# Patient Record
Sex: Male | Born: 1990 | Race: Black or African American | Hispanic: No | Marital: Single | Smoking: Current every day smoker
Health system: Southern US, Community
[De-identification: ages and names within clinical notes are randomized; demographics above are authoritative.]

## PROBLEM LIST (undated history)

## (undated) DIAGNOSIS — Z9119 Patient's noncompliance with other medical treatment and regimen: Secondary | ICD-10-CM

## (undated) DIAGNOSIS — F319 Bipolar disorder, unspecified: Secondary | ICD-10-CM

## (undated) DIAGNOSIS — A539 Syphilis, unspecified: Secondary | ICD-10-CM

## (undated) DIAGNOSIS — F209 Schizophrenia, unspecified: Secondary | ICD-10-CM

## (undated) DIAGNOSIS — Z91199 Patient's noncompliance with other medical treatment and regimen due to unspecified reason: Secondary | ICD-10-CM

## (undated) DIAGNOSIS — B20 Human immunodeficiency virus [HIV] disease: Secondary | ICD-10-CM

## (undated) DIAGNOSIS — D849 Immunodeficiency, unspecified: Secondary | ICD-10-CM

## (undated) DIAGNOSIS — Z21 Asymptomatic human immunodeficiency virus [HIV] infection status: Secondary | ICD-10-CM

## (undated) DIAGNOSIS — A749 Chlamydial infection, unspecified: Secondary | ICD-10-CM

## (undated) DIAGNOSIS — R85619 Unspecified abnormal cytological findings in specimens from anus: Secondary | ICD-10-CM

## (undated) DIAGNOSIS — C801 Malignant (primary) neoplasm, unspecified: Secondary | ICD-10-CM

## (undated) DIAGNOSIS — A549 Gonococcal infection, unspecified: Secondary | ICD-10-CM

## (undated) HISTORY — DX: Patient's noncompliance with other medical treatment and regimen due to unspecified reason: Z91.199

## (undated) HISTORY — DX: Schizophrenia, unspecified: F20.9

## (undated) HISTORY — PX: EYE SURGERY: SHX253

## (undated) HISTORY — DX: Bipolar disorder, unspecified: F31.9

## (undated) HISTORY — PX: ANUS SURGERY: SHX302

## (undated) HISTORY — DX: Human immunodeficiency virus (HIV) disease: B20

## (undated) HISTORY — DX: Syphilis, unspecified: A53.9

## (undated) HISTORY — DX: Patient's noncompliance with other medical treatment and regimen: Z91.19

## (undated) HISTORY — DX: Chlamydial infection, unspecified: A74.9

## (undated) HISTORY — DX: Unspecified abnormal cytological findings in specimens from anus: R85.619

## (undated) HISTORY — DX: Gonococcal infection, unspecified: A54.9

## (undated) HISTORY — DX: Asymptomatic human immunodeficiency virus (hiv) infection status: Z21

---
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
## 2008-03-17 ENCOUNTER — Emergency Department (HOSPITAL_COMMUNITY): Admission: EM | Admit: 2008-03-17 | Discharge: 2008-03-17 | Payer: Self-pay | Admitting: Emergency Medicine

## 2008-03-18 ENCOUNTER — Ambulatory Visit: Payer: Self-pay | Admitting: Psychiatry

## 2008-03-18 ENCOUNTER — Inpatient Hospital Stay (HOSPITAL_COMMUNITY): Admission: AD | Admit: 2008-03-18 | Discharge: 2008-03-21 | Payer: Self-pay | Admitting: Psychiatry

## 2010-03-14 ENCOUNTER — Emergency Department (HOSPITAL_COMMUNITY): Admission: EM | Admit: 2010-03-14 | Discharge: 2010-03-16 | Payer: Self-pay | Admitting: Emergency Medicine

## 2010-03-16 ENCOUNTER — Ambulatory Visit: Payer: Self-pay | Admitting: Psychiatry

## 2010-04-03 ENCOUNTER — Emergency Department (HOSPITAL_COMMUNITY): Admission: EM | Admit: 2010-04-03 | Discharge: 2010-04-03 | Payer: Self-pay | Admitting: Emergency Medicine

## 2010-07-21 ENCOUNTER — Emergency Department (HOSPITAL_COMMUNITY)
Admission: EM | Admit: 2010-07-21 | Discharge: 2010-07-21 | Payer: Self-pay | Source: Home / Self Care | Admitting: Emergency Medicine

## 2010-07-26 LAB — COMPREHENSIVE METABOLIC PANEL
AST: 25 U/L (ref 0–37)
Albumin: 2.6 g/dL — ABNORMAL LOW (ref 3.5–5.2)
BUN: 4 mg/dL — ABNORMAL LOW (ref 6–23)
Chloride: 105 mEq/L (ref 96–112)
Creatinine, Ser: 0.92 mg/dL (ref 0.4–1.5)
GFR calc Af Amer: >60 mL/min (ref >60)
Potassium: 3.6 mEq/L (ref 3.5–5.1)
Total Protein: 8.1 g/dL (ref 6.0–8.3)

## 2010-07-26 LAB — CBC
Hemoglobin: 11.9 g/dL — ABNORMAL LOW (ref 13.0–17.0)
MCH: 29.7 pg (ref 26.0–34.0)
MCHC: 33.2 g/dL (ref 30.0–36.0)
Platelets: 239 10*3/uL (ref 150–400)
RBC: 4.01 MIL/uL — ABNORMAL LOW (ref 4.22–5.81)

## 2010-07-26 LAB — URINALYSIS, ROUTINE W REFLEX MICROSCOPIC
Hgb urine dipstick: NEGATIVE
Specific Gravity, Urine: 1.015 (ref 1.005–1.030)
Urine Glucose, Fasting: NEGATIVE mg/dL
pH: 6.5 (ref 5.0–8.0)

## 2010-07-26 LAB — DIFFERENTIAL
Basophils Absolute: 0 10*3/uL (ref 0.0–0.1)
Basophils Relative: 1 % (ref 0–1)
Eosinophils Absolute: 0.3 10*3/uL (ref 0.0–0.7)
Monocytes Relative: 14 % — ABNORMAL HIGH (ref 3–12)
Neutro Abs: 3.5 10*3/uL (ref 1.7–7.7)
Neutrophils Relative %: 55 % (ref 43–77)

## 2010-07-26 LAB — CK: Total CK: 134 U/L (ref 7–232)

## 2010-09-16 LAB — T-HELPER CELLS (CD4) COUNT (NOT AT ARMC)
CD4 % Helper T Cell: 34 % (ref 33–55)
CD4 T Cell Abs: 440 uL (ref 400–2700)

## 2010-09-16 LAB — DIFFERENTIAL
Basophils Absolute: 0 10*3/uL (ref 0.0–0.1)
Eosinophils Absolute: 0.1 10*3/uL (ref 0.0–0.7)
Eosinophils Relative: 1 % (ref 0–5)
Lymphocytes Relative: 11 % — ABNORMAL LOW (ref 12–46)
Monocytes Absolute: 0.7 10*3/uL (ref 0.1–1.0)

## 2010-09-16 LAB — BASIC METABOLIC PANEL
BUN: 11 mg/dL (ref 6–23)
CO2: 26 mEq/L (ref 19–32)
Chloride: 105 mEq/L (ref 96–112)
Glucose, Bld: 91 mg/dL (ref 70–99)
Potassium: 3.6 mEq/L (ref 3.5–5.1)

## 2010-09-16 LAB — CBC
HCT: 38 % — ABNORMAL LOW (ref 39.0–52.0)
MCH: 31.2 pg (ref 26.0–34.0)
MCV: 89.6 fL (ref 78.0–100.0)
RDW: 14.1 % (ref 11.5–15.5)
WBC: 9.7 10*3/uL (ref 4.0–10.5)

## 2010-09-16 LAB — RAPID URINE DRUG SCREEN, HOSP PERFORMED
Amphetamines: NOT DETECTED
Barbiturates: NOT DETECTED
Benzodiazepines: NOT DETECTED
Cocaine: NOT DETECTED
Opiates: NOT DETECTED

## 2010-09-16 LAB — ETHANOL: Alcohol, Ethyl (B): <5 mg/dL (ref 0–10)

## 2010-09-16 LAB — CK TOTAL AND CKMB (NOT AT ARMC)
CK, MB: 1.6 ng/mL (ref 0.3–4.0)
Relative Index: 0.7 (ref 0.0–2.5)
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
## 2010-11-19 NOTE — Op Note (Signed)
[facility]  Patient:    [name], [name]                     MRN: [id number] Proc. Date: 01/26/01 Adm. Date:  [id number] Attending:  Shara Blazing                           Operative Report  PREOPERATIVE DIAGNOSIS:  Exotropia.  POSTOPERATIVE DIAGNOSIS:  Exotropia.  PROCEDURE:  Lateral rectus muscle recession, 7.5 mm OU.  SURGEON:  Pasty Spillers. Maple [place], M.D.  ANESTHESIA:  General (laryngeal mask).  COMPLICATIONS:  None.  DESCRIPTION OF PROCEDURE:  After routine preoperative evaluation, including informed consent from the mother, the patient was taken to the operating room, where he was identified by me.  General anesthesia was induced without difficulty after placement of appropriate monitors.  The patient was prepped and draped in the standard sterile fashion.  A lid speculum was placed in the right eye.  Through an inferotemporal fornix incision through conjunctiva and Tenons fascia, the right lateral rectus muscle was engaged on a series of muscle hooks and carefully cleared of its surrounding fascial attachments.  The tendon was secured with a double-armed 6-0 Vicryl suture, with a double locking bite at each border of the muscle.  The muscle was disinserted and was then reattached to the sclera at a measured distance of 7.5 mm posterior to the unoperated insertion, using direct scleral passes in crossed-swords fashion.  The suture ends were tied securely after the position of the muscle had been checked and found to be accurate.  The conjunctiva was closed with a single interrupted 6-0 Vicryl suture.  Lid speculum was transferred to the left eye, where an identical procedure was performed, again effecting a 7.5 mg recession of the lateral rectus muscle. Tobradex ointment was placed in each eye.  The patient was awakened without difficulty and taken to the recovery room in stable condition, having suffered no intraoperative or immediate  postoperative complications. DD:  01/26/01 TD:  01/26/01 Job: [id number]
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
## 2011-04-04 LAB — CBC
HCT: 42
MCV: 90
Platelets: 289
RDW: 13.1

## 2011-04-04 LAB — COMPREHENSIVE METABOLIC PANEL
Albumin: 4.1
BUN: 13
Calcium: 9.8
Creatinine, Ser: 0.97
Total Protein: 7.4

## 2011-04-04 LAB — DIFFERENTIAL
Basophils Absolute: 0
Lymphocytes Relative: 19
Monocytes Absolute: 0.7
Monocytes Relative: 9
Neutro Abs: 5.5

## 2011-04-04 LAB — URINALYSIS, ROUTINE W REFLEX MICROSCOPIC
Hgb urine dipstick: NEGATIVE
Nitrite: NEGATIVE
Specific Gravity, Urine: 1.039 — ABNORMAL HIGH
Urobilinogen, UA: 1
pH: 6

## 2011-04-04 LAB — RAPID URINE DRUG SCREEN, HOSP PERFORMED
Amphetamines: NOT DETECTED
Barbiturates: NOT DETECTED
Cocaine: NOT DETECTED
Opiates: NOT DETECTED

## 2011-04-04 LAB — ETHANOL: Alcohol, Ethyl (B): <5

## 2011-06-01 ENCOUNTER — Other Ambulatory Visit: Payer: Self-pay

## 2011-06-01 ENCOUNTER — Emergency Department (HOSPITAL_COMMUNITY): Payer: Self-pay

## 2011-06-01 ENCOUNTER — Emergency Department (HOSPITAL_COMMUNITY)
Admission: EM | Admit: 2011-06-01 | Discharge: 2011-06-02 | Disposition: A | Discharge status: Home / Self Care | Payer: Self-pay | Attending: Emergency Medicine | Admitting: Emergency Medicine

## 2011-06-01 ENCOUNTER — Encounter: Payer: Self-pay | Admitting: Emergency Medicine

## 2011-06-01 DIAGNOSIS — R0602 Shortness of breath: Secondary | ICD-10-CM | POA: Insufficient documentation

## 2011-06-01 DIAGNOSIS — R109 Unspecified abdominal pain: Secondary | ICD-10-CM | POA: Insufficient documentation

## 2011-06-01 DIAGNOSIS — Z21 Asymptomatic human immunodeficiency virus [HIV] infection status: Secondary | ICD-10-CM | POA: Insufficient documentation

## 2011-06-01 DIAGNOSIS — R079 Chest pain, unspecified: Secondary | ICD-10-CM | POA: Insufficient documentation

## 2011-06-01 DIAGNOSIS — R42 Dizziness and giddiness: Secondary | ICD-10-CM | POA: Insufficient documentation

## 2011-06-01 DIAGNOSIS — R55 Syncope and collapse: Secondary | ICD-10-CM | POA: Insufficient documentation

## 2011-06-01 DIAGNOSIS — R10815 Periumbilic abdominal tenderness: Secondary | ICD-10-CM | POA: Insufficient documentation

## 2011-06-01 HISTORY — DX: Immunodeficiency, unspecified: D84.9

## 2011-06-01 HISTORY — DX: Malignant (primary) neoplasm, unspecified: C80.1

## 2011-06-01 LAB — CBC
HCT: 41.4 % (ref 39.0–52.0)
Hemoglobin: 14.2 g/dL (ref 13.0–17.0)
MCH: 30.5 pg (ref 26.0–34.0)
MCHC: 34.3 g/dL (ref 30.0–36.0)

## 2011-06-01 LAB — URINALYSIS, ROUTINE W REFLEX MICROSCOPIC
Ketones, ur: 40 mg/dL — AB
Leukocytes, UA: NEGATIVE
Nitrite: NEGATIVE
Specific Gravity, Urine: 1.026 (ref 1.005–1.030)
pH: 6.5 (ref 5.0–8.0)

## 2011-06-01 LAB — COMPREHENSIVE METABOLIC PANEL
Albumin: 3.7 g/dL (ref 3.5–5.2)
BUN: 12 mg/dL (ref 6–23)
Calcium: 9.1 mg/dL (ref 8.4–10.5)
Creatinine, Ser: 0.8 mg/dL (ref 0.50–1.35)
Total Protein: 8.7 g/dL — ABNORMAL HIGH (ref 6.0–8.3)

## 2011-06-01 LAB — DIFFERENTIAL
Basophils Relative: 0 % (ref 0–1)
Eosinophils Absolute: 0.1 10*3/uL (ref 0.0–0.7)
Monocytes Absolute: 0.8 10*3/uL (ref 0.1–1.0)
Monocytes Relative: 9 % (ref 3–12)

## 2011-06-01 LAB — RAPID URINE DRUG SCREEN, HOSP PERFORMED: Amphetamines: NOT DETECTED

## 2011-06-01 LAB — ETHANOL: Alcohol, Ethyl (B): <11 mg/dL (ref 0–11)

## 2011-06-01 LAB — GLUCOSE, CAPILLARY: Glucose-Capillary: 74 mg/dL (ref 70–99)

## 2011-06-01 LAB — BLOOD GAS, VENOUS
TCO2: 23.2 mmol/L (ref 0–100)
pCO2, Ven: 42.4 mmHg — ABNORMAL LOW (ref 45.0–50.0)
pH, Ven: 7.402 — ABNORMAL HIGH (ref 7.250–7.300)

## 2011-06-01 MED ORDER — SODIUM CHLORIDE 0.9 % IV BOLUS (SEPSIS)
2000.0000 mL | Freq: Once | INTRAVENOUS | Status: AC
  Administered 2011-06-01: 2000 mL via INTRAVENOUS

## 2011-06-01 NOTE — ED Notes (Signed)
Pt. Reports he lost conciousness at home and has been feeling dizzy for one week.  Has not been able to eat or drink anything. Reports some abdominal pain but denies nausea, vomiting, or diarrhea.

## 2011-06-01 NOTE — ED Notes (Signed)
EAV:[id number]> Expected date:06/01/11<BR> Expected time: 6:34 PM<BR> Means of arrival:Ambulance<BR> Comments:<BR> EMS [facility] - cancer patient/dizziness

## 2011-06-01 NOTE — ED Notes (Signed)
Patient is aware of need for urine specimen.  Urinal at bedside. 

## 2011-06-02 LAB — T-HELPER CELLS (CD4) COUNT (NOT AT ARMC): CD4 T Cell Abs: 540 uL (ref 400–2700)

## 2011-06-02 NOTE — ED Provider Notes (Signed)
History     CSN: [id number] Arrival date & time: 06/01/2011  6:50 PM   First MD Initiated Contact with Patient 06/01/11 1932      Chief Complaint  Patient presents with  . Near Syncope    (Consider location/radiation/quality/duration/timing/severity/associated sxs/prior treatment) HPI Patient is a 20 yo M with a history of HIV as well as rectal HPV.  He presents today for an episode of unwitnessed syncope.  Patient has had this before.  Patient was at home and felt lightheaded upon standing from sitting on the toilet.  He "felt things were going black" and said this worsened as he walked down th hallway.  The patient woke up laying on the floor several minutes later (based on the number of missed calls on his cell phone).  Patient denied any chest pain, shortness of breath, vomiting, incontinence, injuries, fevers.  He admits some recent URI symptoms as well as anorexia and not eating and drinking very much.  Patient is uncertain when he last saw his ID doctor at [facility].  He also has been "stretching" his HAART drugs and is not taking them regularly.  The patient reports that he does not know his last CD4 count.  He is not on any chronic antibiotics for infection prevention.  Currently patient complains of mild periumbilical discomfort that he rates as 9/10.  There are no other associated or modifying factors.  Past Medical History  Diagnosis Date  . Immune deficiency disorder   . Cancer     Past Surgical History  Procedure Date  . Eye surgery     History reviewed. No pertinent family history.  History  Substance Use Topics  . Smoking status: Current Everyday Smoker -- 0.5 packs/day  . Smokeless tobacco: Not on file  . Alcohol Use: Yes      Review of Systems  Constitutional: Positive for appetite change. Negative for fever.  HENT: Negative.   Eyes: Negative.   Respiratory: Negative.   Cardiovascular: Negative.   Gastrointestinal: Positive for abdominal pain.    Genitourinary: Negative.   Musculoskeletal: Negative.   Skin: Negative.   Neurological: Positive for syncope and light-headedness.  Hematological: Negative.   Psychiatric/Behavioral: Negative.   All other systems reviewed and are negative.    Allergies  Review of patient's allergies indicates no known allergies.  Home Medications   Current Outpatient Rx  Name Route Sig Dispense Refill  . ASPIRIN 81 MG PO CHEW Oral Chew 81 mg by mouth daily.      . ATAZANAVIR SULFATE 300 MG PO CAPS Oral Take 300 mg by mouth daily.      Marland Kitchen EMTRICITABINE-TENOFOVIR 200-300 MG PO TABS Oral Take 1 tablet by mouth daily.      Marland Kitchen RITONAVIR 100 MG PO CAPS Oral Take 100 mg by mouth daily.      Marland Kitchen VALACYCLOVIR HCL 1 G PO TABS Oral Take 1,000 mg by mouth daily.        BP 107/63  Pulse 55  Temp(Src) 97.6 F (36.4 C) (Oral)  Resp 18  Ht 6\' 4"  (1.93 m)  Wt 190 lb (86.183 kg)  BMI 23.13 kg/m2  SpO2 100%  Physical Exam  Nursing note and vitals reviewed. Constitutional: He is oriented to person, place, and time. He appears well-developed and well-nourished. No distress.  HENT:  Head: Normocephalic and atraumatic.  Eyes: Conjunctivae and EOM are normal. Pupils are equal, round, and reactive to light.  Neck: Normal range of motion.  Cardiovascular: Normal rate, regular rhythm, normal  heart sounds and intact distal pulses.  Exam reveals no gallop and no friction rub.   No murmur heard. Pulmonary/Chest: Effort normal and breath sounds normal. No respiratory distress. He has no wheezes. He has no rales.  Abdominal: Soft. Bowel sounds are normal. He exhibits no distension. There is tenderness. There is no rebound and no guarding.       Patient with TTP over the umbilicus which is distractible and mild  Musculoskeletal: Normal range of motion. He exhibits no edema.  Neurological: He is alert and oriented to person, place, and time. No cranial nerve deficit. He exhibits normal muscle tone. Coordination normal.   Skin: Skin is warm and dry. No rash noted.  Psychiatric: He has a normal mood and affect.    ED Course  Procedures (including critical care time)  Date: 06/02/2011  Rate: 54  Rhythm: sinus arrhythmia  QRS Axis: normal  Intervals: normal  ST/T Wave abnormalities: early repolarization  Conduction Disutrbances:none  Narrative Interpretation:   Old EKG Reviewed: unchanged  Labs Reviewed  COMPREHENSIVE METABOLIC PANEL - Abnormal; Notable for the following:    Sodium 133 (*)    Potassium 3.3 (*)    Total Protein 8.7 (*)    All other components within normal limits  URINE RAPID DRUG SCREEN (HOSP PERFORMED) - Abnormal; Notable for the following:    Tetrahydrocannabinol POSITIVE (*)    All other components within normal limits  URINALYSIS, ROUTINE W REFLEX MICROSCOPIC - Abnormal; Notable for the following:    Bilirubin Urine SMALL (*)    Ketones, ur 40 (*)    All other components within normal limits  BLOOD GAS, VENOUS - Abnormal; Notable for the following:    pH, Ven 7.402 (*)    pCO2, Ven 42.4 (*)    Bicarbonate 25.9 (*)    All other components within normal limits  CBC  DIFFERENTIAL  ETHANOL  GLUCOSE, CAPILLARY  BLOOD GAS, VENOUS  CULTURE, BLOOD (ROUTINE X 2)  URINE CULTURE  POCT CBG MONITORING  T-HELPER CELLS (CD4) COUNT   Dg Chest 2 View  06/01/2011  *RADIOLOGY REPORT*  Clinical Data:  Syncope, shortness of breath, chest pain and history of HIV.  CHEST - 2 VIEW  Comparison: 03/16/2010  Findings: The heart size and mediastinal contours are within normal limits.  Both lungs are clear.  The visualized skeletal structures are unremarkable.  IMPRESSION: No active disease.  Original Report Authenticated By: Reola Calkins, M.D.   Ct Head Wo Contrast  06/01/2011  *RADIOLOGY REPORT*  Clinical Data: Syncope; history of HIV.  CT HEAD WITHOUT CONTRAST  Technique:  Contiguous axial images were obtained from the base of the skull through the vertex without contrast.  Comparison:  None.  Findings: There is no evidence of acute infarction, mass lesion, or intra- or extra-axial hemorrhage on CT.  The posterior fossa, including the cerebellum, brainstem and fourth ventricle, is within normal limits.  The third and lateral ventricles, and basal ganglia are unremarkable in appearance.  The cerebral hemispheres are symmetric in appearance, with normal gray- white differentiation.  No mass effect or midline shift is seen.  There is no evidence of fracture; visualized osseous structures are unremarkable in appearance.  The orbits are within normal limits. The paranasal sinuses and mastoid air cells are well-aerated.  No significant soft tissue abnormalities are seen.  IMPRESSION: Unremarkable noncontrast CT of the head.  Original Report Authenticated By: Tonia Ghent, M.D.     1. Vasovagal syncope  MDM  Patient presented after an episode of unwitnessed syncope.  He has had similar episodes before.  Though the patient initially denied having this worked up previously he later stated that "All kinds of doctors have talked to me about this and no one knows what this is."  The patient denied any concerning findings such as associated injury, incontinence, prodrome, chest pain, palpitations, headache, symptoms of GI bleeding, or other findings.  He denies history of any form of meningitis.  He had work-up for possible causes of syncope and had an unremarkable ECG, a normal head CT, a normal CBC, and unremarkable renal panel.  Patient did admit not eating and drinking well and urine ketones reflected this.  Patient states he doesn't eat because he is not hungry.  I had no concern for eating disorder.  Patient described scenario consistent with vasovagal response following toileting as well as orthostasis.  He appeared orthostatic on exam in ED and was given 2 L NS IV bolus.  He was able to ambulate independently to the bathroom after this.  Patient had no findings indicating likely serious  etiology based on SF syncope rules. I briefly discussed patient's case with hospitalist who agreed with my assessment as patient notified me that his mother wanted him admitted for observation.  Patient and I discussed why I felt discharge was appropriate and he was discharged in good condition.  Patient was strongly encourage to follow-up with his physicians.  I offered to refer him to our ID doctors if geographic location was a barrier to regular care and patient declined this.  Patient was discharged in good condition.          Cyndra Numbers, MD 06/02/11 [phone]

## 2011-06-03 LAB — URINE CULTURE: Culture  Setup Time: 201211290231

## 2011-06-08 LAB — CULTURE, BLOOD (ROUTINE X 2): Culture: NO GROWTH

## 2012-09-11 ENCOUNTER — Ambulatory Visit: Payer: Self-pay

## 2012-10-04 ENCOUNTER — Ambulatory Visit: Payer: Self-pay

## 2012-10-05 ENCOUNTER — Telehealth: Payer: Self-pay

## 2012-10-05 NOTE — Telephone Encounter (Signed)
DIS informed of missed intake appointment.    I will try to contact patient for reschedule.   Laurell Josephs, RN

## 2012-11-20 ENCOUNTER — Other Ambulatory Visit: Payer: Self-pay | Admitting: Infectious Disease

## 2012-11-20 ENCOUNTER — Ambulatory Visit: Payer: Self-pay

## 2012-11-20 DIAGNOSIS — B20 Human immunodeficiency virus [HIV] disease: Secondary | ICD-10-CM

## 2012-11-20 DIAGNOSIS — J45909 Unspecified asthma, uncomplicated: Secondary | ICD-10-CM

## 2012-11-20 LAB — CBC WITH DIFFERENTIAL/PLATELET
Basophils Relative: 0 % (ref 0–1)
Eosinophils Absolute: 0.1 10*3/uL (ref 0.0–0.7)
HCT: 46.1 % (ref 39.0–52.0)
Hemoglobin: 15.9 g/dL (ref 13.0–17.0)
MCH: 29.9 pg (ref 26.0–34.0)
MCHC: 34.5 g/dL (ref 30.0–36.0)
Monocytes Absolute: 0.6 10*3/uL (ref 0.1–1.0)
Monocytes Relative: 11 % (ref 3–12)
Neutro Abs: 3.4 10*3/uL (ref 1.7–7.7)

## 2012-11-20 LAB — RPR

## 2012-11-20 LAB — COMPLETE METABOLIC PANEL WITH GFR
Albumin: 3.9 g/dL (ref 3.5–5.2)
Alkaline Phosphatase: 48 U/L (ref 39–117)
BUN: 7 mg/dL (ref 6–23)
GFR, Est Non African American: >89 mL/min
Glucose, Bld: 76 mg/dL (ref 70–99)
Potassium: 3.8 mEq/L (ref 3.5–5.3)

## 2012-11-20 LAB — URINALYSIS
Bilirubin Urine: NEGATIVE
Glucose, UA: NEGATIVE mg/dL
Specific Gravity, Urine: 1.023 (ref 1.005–1.030)
pH: 6.5 (ref 5.0–8.0)

## 2012-11-20 LAB — LIPID PANEL
Cholesterol: 124 mg/dL (ref 0–200)
HDL: 39 mg/dL — ABNORMAL LOW (ref >39)
Total CHOL/HDL Ratio: 3.2 Ratio
Triglycerides: 105 mg/dL (ref <150)
VLDL: 21 mg/dL (ref 0–40)

## 2012-11-21 DIAGNOSIS — J45909 Unspecified asthma, uncomplicated: Secondary | ICD-10-CM | POA: Insufficient documentation

## 2012-11-21 DIAGNOSIS — B20 Human immunodeficiency virus [HIV] disease: Secondary | ICD-10-CM | POA: Insufficient documentation

## 2012-11-21 LAB — HEPATITIS B CORE ANTIBODY, TOTAL: Hep B Core Total Ab: NEGATIVE
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
## 2012-11-30 LAB — HIV-1 GENOTYPR PLUS

## 2012-12-03 ENCOUNTER — Ambulatory Visit: Payer: Self-pay | Admitting: Infectious Disease

## 2012-12-17 ENCOUNTER — Telehealth: Payer: Self-pay

## 2012-12-17 ENCOUNTER — Encounter: Payer: Self-pay | Admitting: Infectious Disease

## 2012-12-17 ENCOUNTER — Ambulatory Visit (INDEPENDENT_AMBULATORY_CARE_PROVIDER_SITE_OTHER): Payer: Self-pay | Admitting: Infectious Disease

## 2012-12-17 VITALS — BP 115/77 | HR 65 | Temp 98.1°F | Ht 75.0 in | Wt 187.0 lb

## 2012-12-17 DIAGNOSIS — K921 Melena: Secondary | ICD-10-CM

## 2012-12-17 DIAGNOSIS — Z91199 Patient's noncompliance with other medical treatment and regimen due to unspecified reason: Secondary | ICD-10-CM | POA: Insufficient documentation

## 2012-12-17 DIAGNOSIS — F209 Schizophrenia, unspecified: Secondary | ICD-10-CM

## 2012-12-17 DIAGNOSIS — Z9119 Patient's noncompliance with other medical treatment and regimen: Secondary | ICD-10-CM | POA: Insufficient documentation

## 2012-12-17 DIAGNOSIS — IMO0002 Reserved for concepts with insufficient information to code with codable children: Secondary | ICD-10-CM

## 2012-12-17 DIAGNOSIS — A749 Chlamydial infection, unspecified: Secondary | ICD-10-CM | POA: Insufficient documentation

## 2012-12-17 DIAGNOSIS — B20 Human immunodeficiency virus [HIV] disease: Secondary | ICD-10-CM

## 2012-12-17 DIAGNOSIS — Z789 Other specified health status: Secondary | ICD-10-CM | POA: Insufficient documentation

## 2012-12-17 DIAGNOSIS — L0292 Furuncle, unspecified: Secondary | ICD-10-CM

## 2012-12-17 DIAGNOSIS — F17200 Nicotine dependence, unspecified, uncomplicated: Secondary | ICD-10-CM

## 2012-12-17 DIAGNOSIS — A4902 Methicillin resistant Staphylococcus aureus infection, unspecified site: Secondary | ICD-10-CM

## 2012-12-17 DIAGNOSIS — R6889 Other general symptoms and signs: Secondary | ICD-10-CM

## 2012-12-17 DIAGNOSIS — F1210 Cannabis abuse, uncomplicated: Secondary | ICD-10-CM

## 2012-12-17 DIAGNOSIS — A5400 Gonococcal infection of lower genitourinary tract, unspecified: Secondary | ICD-10-CM | POA: Insufficient documentation

## 2012-12-17 DIAGNOSIS — F319 Bipolar disorder, unspecified: Secondary | ICD-10-CM | POA: Insufficient documentation

## 2012-12-17 DIAGNOSIS — R21 Rash and other nonspecific skin eruption: Secondary | ICD-10-CM | POA: Insufficient documentation

## 2012-12-17 DIAGNOSIS — L0293 Carbuncle, unspecified: Secondary | ICD-10-CM

## 2012-12-17 DIAGNOSIS — B009 Herpesviral infection, unspecified: Secondary | ICD-10-CM | POA: Insufficient documentation

## 2012-12-17 DIAGNOSIS — A539 Syphilis, unspecified: Secondary | ICD-10-CM | POA: Insufficient documentation

## 2012-12-17 DIAGNOSIS — A549 Gonococcal infection, unspecified: Secondary | ICD-10-CM | POA: Insufficient documentation

## 2012-12-17 DIAGNOSIS — R85619 Unspecified abnormal cytological findings in specimens from anus: Secondary | ICD-10-CM | POA: Insufficient documentation

## 2012-12-17 DIAGNOSIS — F1290 Cannabis use, unspecified, uncomplicated: Secondary | ICD-10-CM

## 2012-12-17 MED ORDER — ALBUTEROL SULFATE HFA 108 (90 BASE) MCG/ACT IN AERS: 2.0000 | INHALATION_SPRAY | Freq: Four times a day (QID) | RESPIRATORY_TRACT | Status: DC | PRN

## 2012-12-17 MED ORDER — SULFAMETHOXAZOLE-TMP DS 800-160 MG PO TABS: 2.0000 | ORAL_TABLET | Freq: Two times a day (BID) | ORAL | Status: DC

## 2012-12-17 MED ORDER — TRIAMCINOLONE ACETONIDE 0.5 % EX OINT: TOPICAL_OINTMENT | Freq: Two times a day (BID) | CUTANEOUS | Status: DC

## 2012-12-17 NOTE — Telephone Encounter (Signed)
Spoke with patient's grandmother.  I was given permission during intake from patient to correspond with Grandmother if needed.   Trevin has missed two visits with the physician.  Today he did not call to cancel or reschedule.   Trinetta Alemu, Case Manager with [facility] has reached out to patient regarding missed appointments.  Today I spoke with his grandmother to inform her he has missed two appointments. She has assured me she would call him and make sure he gets to the next appointment. I explained another missed appointment would mean he is no longer able to schedule the next and would need to come for walk in clinic.   Laurell Josephs, RN

## 2012-12-17 NOTE — Progress Notes (Signed)
Subjective:    Patient ID: [name], male    DOB: 04/09/1991, 22 y.o.   MRN: [id number]  HPI  22 year old [place] American man with history of HIV, called initially at [facility] none at [facility] with a history of intermittent compliance with antiretroviral medications. He was previously on Viramune he claims along with Truvada and boosted Reyataz. 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 While he was at [facility] he did achieve undetectable viral load on a regimen of Reyataz Norvir Truvada. We're still awaiting records of any genotypes.[facility]. For the moment the only notation that I know of isA98G,K103N documented here.  I emphasized to him the utmost importance of him being back on antiretroviral medications and being consistently on him without episodes of being off of the antiretroviral medications. I've emphasized that there clear-cut study showing increased risk of death and morbidity if he would continue with this stop and start approach to his HIV infection. He is ready to restart Reyataz Norvir Truvada.  He also has had several other complaints recently specifically he has had a pruritic rash in his antecubital fossa the left and right as well as on his abdomen which has responded slightly to moisturizers. Not well to over-the-counter hydrocortisone. He is also planning of what appears to be an early furuncle in his lower back. I'll prescribe him Bactrim for this and asked him to apply warm compresses.  Finally he has reported some blood found in his stool after he had not had a bowel movement for several several days. He does reports it has had a history of abnormal anal Pap smears and that he went for anal anoscopy but did not tolerate the procedure well. Today he did not want me to examine his rectum.  I spent greater than 60 minutes with  the patient including greater than 50% of time in face to face counsel of the patient re HIV and treatment and in coordination of their care.     Review of Systems  Constitutional: Negative for fever, chills, diaphoresis, activity change, appetite change, fatigue and unexpected weight change.  HENT: Negative for congestion, sore throat, rhinorrhea, sneezing, trouble swallowing and sinus pressure.   Eyes: Negative for photophobia and visual disturbance.  Respiratory: Negative for cough, chest tightness, shortness of breath, wheezing and stridor.   Cardiovascular: Negative for chest pain, palpitations and leg swelling.  Gastrointestinal: Negative for nausea, vomiting, abdominal pain, diarrhea, constipation, blood in stool, abdominal distention and anal bleeding.  Genitourinary: Negative for dysuria, hematuria, flank pain and difficulty urinating.  Musculoskeletal: Negative for myalgias, back pain, joint swelling, arthralgias and gait problem.  Skin: Negative for color change, pallor, rash and wound.  Neurological: Negative for dizziness, tremors, weakness and light-headedness.  Hematological: Negative for adenopathy. Does not bruise/bleed easily.  Psychiatric/Behavioral: Negative for behavioral problems, confusion, sleep disturbance, dysphoric mood, decreased concentration and agitation.       Objective:   Physical Exam  Constitutional: He is oriented to person, place, and time. He appears well-developed and well-nourished. No distress.  HENT:  Head: Normocephalic and atraumatic.  Mouth/Throat: Oropharynx is clear and moist. No oropharyngeal exudate.  Eyes: Conjunctivae and EOM are normal. Pupils are equal, round, and reactive to light. No scleral icterus.  Neck: Normal range of motion. Neck supple. No JVD present.  Cardiovascular: Normal rate, regular rhythm and normal heart sounds.  Exam reveals  no gallop and no friction rub.   No murmur heard. Pulmonary/Chest: Effort normal and breath  sounds normal. No respiratory distress. He has no wheezes. He has no rales. He exhibits no tenderness.  Abdominal: He exhibits no distension and no mass. There is no tenderness. There is no rebound and no guarding.  Genitourinary:     Musculoskeletal: He exhibits no edema and no tenderness.  Lymphadenopathy:    He has no cervical adenopathy.  Neurological: He is alert and oriented to person, place, and time. He has normal reflexes. He exhibits normal muscle tone. Coordination normal.  Skin: Skin is warm and dry. He is not diaphoretic. No erythema. No pallor.     Psychiatric: He has a normal mood and affect. His behavior is normal. Judgment and thought content normal.          Assessment & Plan:  HIV: restart Reyataz Norvir Truvada bring back to clinic in one month's time with recheck of viral load and CD4 count obtained genotype some [facility] as well as [facility]  Furuncle : Start Bactrim 2 double strength tablets twice daily for 10 days with warm compresses  Blood in stools: Likely due to hemorrhoids: No need for urgent evaluation.    History abnormal Pap smear: Offered repeat Pap smear. We also could have him seen for high-resolution anoscopy although he stated he did not tolerated procedure. Other options would be to refer him to surgery here or back at [facility].  Schizophrenia bipolar do: listed in HX at [facility]: did not know of this at visit, will need to inquire if someone is helping him care for this  Smoking: encouraged him to stop tobacco, he was trying to use tobacco to help him quit marijuana, I told him my concern was FAR greater witht the former

## 2012-12-19 ENCOUNTER — Other Ambulatory Visit: Payer: Self-pay | Admitting: Licensed Clinical Social Worker

## 2012-12-19 DIAGNOSIS — B20 Human immunodeficiency virus [HIV] disease: Secondary | ICD-10-CM

## 2012-12-19 DIAGNOSIS — L0292 Furuncle, unspecified: Secondary | ICD-10-CM

## 2012-12-19 MED ORDER — EMTRICITABINE-TENOFOVIR DF 200-300 MG PO TABS: 1.0000 | ORAL_TABLET | Freq: Every day | ORAL | Status: DC

## 2012-12-19 MED ORDER — RITONAVIR 100 MG PO CAPS: 100.0000 mg | ORAL_CAPSULE | Freq: Every day | ORAL | Status: DC

## 2012-12-19 MED ORDER — ATAZANAVIR SULFATE 300 MG PO CAPS: 300.0000 mg | ORAL_CAPSULE | Freq: Every day | ORAL | Status: DC

## 2012-12-30 ENCOUNTER — Encounter (HOSPITAL_COMMUNITY): Payer: Self-pay | Admitting: Emergency Medicine

## 2012-12-30 ENCOUNTER — Emergency Department (HOSPITAL_COMMUNITY)
Admission: EM | Admit: 2012-12-30 | Discharge: 2012-12-30 | Disposition: A | Discharge status: Home / Self Care | Payer: No Typology Code available for payment source | Attending: Emergency Medicine | Admitting: Emergency Medicine

## 2012-12-30 DIAGNOSIS — F319 Bipolar disorder, unspecified: Secondary | ICD-10-CM | POA: Insufficient documentation

## 2012-12-30 DIAGNOSIS — Y939 Activity, unspecified: Secondary | ICD-10-CM | POA: Insufficient documentation

## 2012-12-30 DIAGNOSIS — F209 Schizophrenia, unspecified: Secondary | ICD-10-CM | POA: Insufficient documentation

## 2012-12-30 DIAGNOSIS — F17200 Nicotine dependence, unspecified, uncomplicated: Secondary | ICD-10-CM | POA: Insufficient documentation

## 2012-12-30 DIAGNOSIS — Z8619 Personal history of other infectious and parasitic diseases: Secondary | ICD-10-CM | POA: Insufficient documentation

## 2012-12-30 DIAGNOSIS — L03211 Cellulitis of face: Secondary | ICD-10-CM | POA: Insufficient documentation

## 2012-12-30 DIAGNOSIS — L0201 Cutaneous abscess of face: Secondary | ICD-10-CM | POA: Insufficient documentation

## 2012-12-30 DIAGNOSIS — Z21 Asymptomatic human immunodeficiency virus [HIV] infection status: Secondary | ICD-10-CM | POA: Insufficient documentation

## 2012-12-30 DIAGNOSIS — Z79899 Other long term (current) drug therapy: Secondary | ICD-10-CM | POA: Insufficient documentation

## 2012-12-30 DIAGNOSIS — Y92410 Unspecified street and highway as the place of occurrence of the external cause: Secondary | ICD-10-CM | POA: Insufficient documentation

## 2012-12-30 MED ORDER — LIDOCAINE-EPINEPHRINE (PF) 2 %-1:200000 IJ SOLN
10.0000 mL | Freq: Once | INTRAMUSCULAR | Status: AC
  Administered 2012-12-30: 10 mL via INTRADERMAL

## 2012-12-30 MED ORDER — METHOCARBAMOL 500 MG PO TABS: 1000.0000 mg | ORAL_TABLET | Freq: Four times a day (QID) | ORAL | Status: DC

## 2012-12-30 MED ORDER — IBUPROFEN 200 MG PO TABS
600.0000 mg | ORAL_TABLET | Freq: Once | ORAL | Status: AC
  Administered 2012-12-30: 600 mg via ORAL
  Filled 2012-12-30: qty 3

## 2012-12-30 MED ORDER — LIDOCAINE-EPINEPHRINE 2 %-1:100000 IJ SOLN
INTRAMUSCULAR | Status: AC
  Filled 2012-12-30: qty 1

## 2012-12-30 MED ORDER — IBUPROFEN 600 MG PO TABS: 600.0000 mg | ORAL_TABLET | Freq: Four times a day (QID) | ORAL | Status: DC | PRN

## 2012-12-30 MED ORDER — HYDROCODONE-ACETAMINOPHEN 5-325 MG PO TABS
1.0000 | ORAL_TABLET | Freq: Once | ORAL | Status: AC
  Administered 2012-12-30: 1 via ORAL
  Filled 2012-12-30: qty 1

## 2012-12-30 MED ORDER — CLINDAMYCIN HCL 150 MG PO CAPS: 300.0000 mg | ORAL_CAPSULE | Freq: Four times a day (QID) | ORAL | Status: DC
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
## 2012-12-30 NOTE — ED Provider Notes (Signed)
Medical screening examination/treatment/procedure(s) were performed by non-physician practitioner and as supervising physician I was immediately available for consultation/collaboration. Devoria Albe, MD, Armando Gang   Ward Givens, MD 12/30/12 1344

## 2012-12-31 ENCOUNTER — Other Ambulatory Visit: Payer: Self-pay | Admitting: *Deleted

## 2012-12-31 DIAGNOSIS — B20 Human immunodeficiency virus [HIV] disease: Secondary | ICD-10-CM

## 2012-12-31 MED ORDER — RITONAVIR 100 MG PO CAPS: 100.0000 mg | ORAL_CAPSULE | Freq: Every day | ORAL | Status: DC

## 2012-12-31 MED ORDER — EMTRICITABINE-TENOFOVIR DF 200-300 MG PO TABS: 1.0000 | ORAL_TABLET | Freq: Every day | ORAL | Status: DC

## 2012-12-31 MED ORDER — ATAZANAVIR SULFATE 300 MG PO CAPS: 300.0000 mg | ORAL_CAPSULE | Freq: Every day | ORAL | Status: DC

## 2012-12-31 NOTE — Progress Notes (Signed)
Pt ADAP approved per Tammy @[facility] .  Meds sent to pharmacy for monthly delivery as requested.

## 2013-01-05 ENCOUNTER — Inpatient Hospital Stay (HOSPITAL_COMMUNITY)
Admission: EM | Admit: 2013-01-05 | Discharge: 2013-01-10 | DRG: 347 | Disposition: A | Discharge status: Home / Self Care | Payer: Self-pay | Attending: Internal Medicine | Admitting: Internal Medicine

## 2013-01-05 ENCOUNTER — Emergency Department (HOSPITAL_COMMUNITY): Payer: Self-pay

## 2013-01-05 ENCOUNTER — Encounter (HOSPITAL_COMMUNITY): Payer: Self-pay | Admitting: Emergency Medicine

## 2013-01-05 DIAGNOSIS — K611 Rectal abscess: Secondary | ICD-10-CM

## 2013-01-05 DIAGNOSIS — F209 Schizophrenia, unspecified: Secondary | ICD-10-CM

## 2013-01-05 DIAGNOSIS — A4901 Methicillin susceptible Staphylococcus aureus infection, unspecified site: Secondary | ICD-10-CM | POA: Diagnosis present

## 2013-01-05 DIAGNOSIS — F319 Bipolar disorder, unspecified: Secondary | ICD-10-CM | POA: Diagnosis present

## 2013-01-05 DIAGNOSIS — L0390 Cellulitis, unspecified: Secondary | ICD-10-CM

## 2013-01-05 DIAGNOSIS — B20 Human immunodeficiency virus [HIV] disease: Secondary | ICD-10-CM

## 2013-01-05 DIAGNOSIS — R209 Unspecified disturbances of skin sensation: Secondary | ICD-10-CM | POA: Diagnosis present

## 2013-01-05 DIAGNOSIS — Z791 Long term (current) use of non-steroidal anti-inflammatories (NSAID): Secondary | ICD-10-CM

## 2013-01-05 DIAGNOSIS — A4902 Methicillin resistant Staphylococcus aureus infection, unspecified site: Secondary | ICD-10-CM

## 2013-01-05 DIAGNOSIS — R609 Edema, unspecified: Secondary | ICD-10-CM | POA: Diagnosis present

## 2013-01-05 DIAGNOSIS — E876 Hypokalemia: Secondary | ICD-10-CM

## 2013-01-05 DIAGNOSIS — J45909 Unspecified asthma, uncomplicated: Secondary | ICD-10-CM | POA: Diagnosis present

## 2013-01-05 DIAGNOSIS — R112 Nausea with vomiting, unspecified: Secondary | ICD-10-CM | POA: Diagnosis present

## 2013-01-05 DIAGNOSIS — J4520 Mild intermittent asthma, uncomplicated: Secondary | ICD-10-CM

## 2013-01-05 DIAGNOSIS — A5400 Gonococcal infection of lower genitourinary tract, unspecified: Secondary | ICD-10-CM

## 2013-01-05 DIAGNOSIS — L0292 Furuncle, unspecified: Secondary | ICD-10-CM

## 2013-01-05 DIAGNOSIS — Z91199 Patient's noncompliance with other medical treatment and regimen due to unspecified reason: Secondary | ICD-10-CM

## 2013-01-05 DIAGNOSIS — A749 Chlamydial infection, unspecified: Secondary | ICD-10-CM

## 2013-01-05 DIAGNOSIS — B009 Herpesviral infection, unspecified: Secondary | ICD-10-CM

## 2013-01-05 DIAGNOSIS — K612 Anorectal abscess: Principal | ICD-10-CM | POA: Diagnosis present

## 2013-01-05 DIAGNOSIS — A539 Syphilis, unspecified: Secondary | ICD-10-CM

## 2013-01-05 DIAGNOSIS — L0291 Cutaneous abscess, unspecified: Secondary | ICD-10-CM

## 2013-01-05 DIAGNOSIS — E871 Hypo-osmolality and hyponatremia: Secondary | ICD-10-CM

## 2013-01-05 DIAGNOSIS — M549 Dorsalgia, unspecified: Secondary | ICD-10-CM

## 2013-01-05 DIAGNOSIS — Z79899 Other long term (current) drug therapy: Secondary | ICD-10-CM

## 2013-01-05 DIAGNOSIS — Z9119 Patient's noncompliance with other medical treatment and regimen: Secondary | ICD-10-CM

## 2013-01-05 DIAGNOSIS — F17200 Nicotine dependence, unspecified, uncomplicated: Secondary | ICD-10-CM | POA: Diagnosis present

## 2013-01-05 LAB — CBC
HCT: 44.2 % (ref 39.0–52.0)
MCHC: 35.3 g/dL (ref 30.0–36.0)
MCV: 85.5 fL (ref 78.0–100.0)
Platelets: 231 10*3/uL (ref 150–400)
RDW: 13.1 % (ref 11.5–15.5)

## 2013-01-05 LAB — BASIC METABOLIC PANEL
BUN: 16 mg/dL (ref 6–23)
Calcium: 9.6 mg/dL (ref 8.4–10.5)
Creatinine, Ser: 1.07 mg/dL (ref 0.50–1.35)
GFR calc Af Amer: >90 mL/min (ref >90)
GFR calc non Af Amer: >90 mL/min (ref >90)

## 2013-01-05 LAB — PROCALCITONIN: Procalcitonin: 0.2 ng/mL

## 2013-01-05 LAB — GLUCOSE, CAPILLARY: Glucose-Capillary: 82 mg/dL (ref 70–99)

## 2013-01-05 MED ORDER — ATAZANAVIR SULFATE 150 MG PO CAPS
300.0000 mg | ORAL_CAPSULE | Freq: Every day | ORAL | Status: DC
  Administered 2013-01-05: 300 mg via ORAL
  Filled 2013-01-05 (×2): qty 2

## 2013-01-05 MED ORDER — ALBUTEROL SULFATE HFA 108 (90 BASE) MCG/ACT IN AERS
2.0000 | INHALATION_SPRAY | Freq: Four times a day (QID) | RESPIRATORY_TRACT | Status: DC | PRN
  Filled 2013-01-05: qty 6.7

## 2013-01-05 MED ORDER — PIPERACILLIN-TAZOBACTAM 3.375 G IVPB 30 MIN
3.3750 g | Freq: Once | INTRAVENOUS | Status: AC
  Administered 2013-01-05: 3.375 g via INTRAVENOUS
  Filled 2013-01-05: qty 50

## 2013-01-05 MED ORDER — IBUPROFEN 600 MG PO TABS
600.0000 mg | ORAL_TABLET | Freq: Four times a day (QID) | ORAL | Status: DC | PRN
  Filled 2013-01-05: qty 1

## 2013-01-05 MED ORDER — PIPERACILLIN-TAZOBACTAM 3.375 G IVPB
3.3750 g | Freq: Three times a day (TID) | INTRAVENOUS | Status: DC
  Administered 2013-01-06: 3.375 g via INTRAVENOUS
  Filled 2013-01-05 (×3): qty 50

## 2013-01-05 MED ORDER — ONDANSETRON HCL 4 MG/2ML IJ SOLN
4.0000 mg | Freq: Four times a day (QID) | INTRAMUSCULAR | Status: DC | PRN
  Administered 2013-01-06 – 2013-01-08 (×4): 4 mg via INTRAVENOUS
  Filled 2013-01-05 (×4): qty 2

## 2013-01-05 MED ORDER — SODIUM CHLORIDE 0.9 % IV SOLN
INTRAVENOUS | Status: DC
  Administered 2013-01-05 – 2013-01-08 (×6): via INTRAVENOUS
  Administered 2013-01-09 – 2013-01-10 (×2): 20 mL/h via INTRAVENOUS

## 2013-01-05 MED ORDER — TRIAMCINOLONE ACETONIDE 0.5 % EX OINT
TOPICAL_OINTMENT | Freq: Two times a day (BID) | CUTANEOUS | Status: DC
  Administered 2013-01-05 – 2013-01-08 (×6): via TOPICAL
  Administered 2013-01-09: 1 via TOPICAL
  Administered 2013-01-10: 10:00:00 via TOPICAL
  Filled 2013-01-05: qty 15

## 2013-01-05 MED ORDER — ONDANSETRON HCL 4 MG/2ML IJ SOLN
4.0000 mg | Freq: Once | INTRAMUSCULAR | Status: AC
  Administered 2013-01-05: 4 mg via INTRAVENOUS
  Filled 2013-01-05: qty 2

## 2013-01-05 MED ORDER — HYDROMORPHONE HCL PF 1 MG/ML IJ SOLN
1.0000 mg | Freq: Once | INTRAMUSCULAR | Status: AC
  Administered 2013-01-05: 1 mg via INTRAVENOUS
  Filled 2013-01-05: qty 1

## 2013-01-05 MED ORDER — ONDANSETRON HCL 4 MG PO TABS
4.0000 mg | ORAL_TABLET | Freq: Four times a day (QID) | ORAL | Status: DC | PRN
  Administered 2013-01-08 – 2013-01-09 (×3): 4 mg via ORAL
  Filled 2013-01-05 (×3): qty 1

## 2013-01-05 MED ORDER — CLINDAMYCIN PHOSPHATE 600 MG/50ML IV SOLN: 600.0000 mg | Freq: Once | INTRAVENOUS | Status: DC

## 2013-01-05 MED ORDER — VANCOMYCIN HCL IN DEXTROSE 1-5 GM/200ML-% IV SOLN
1000.0000 mg | Freq: Three times a day (TID) | INTRAVENOUS | Status: DC
  Administered 2013-01-05 – 2013-01-10 (×14): 1000 mg via INTRAVENOUS
  Filled 2013-01-05 (×15): qty 200

## 2013-01-05 MED ORDER — METHOCARBAMOL 500 MG PO TABS
1000.0000 mg | ORAL_TABLET | Freq: Four times a day (QID) | ORAL | Status: DC
  Administered 2013-01-05 – 2013-01-10 (×18): 1000 mg via ORAL
  Filled 2013-01-05 (×26): qty 2

## 2013-01-05 MED ORDER — HYDROMORPHONE HCL PF 1 MG/ML IJ SOLN
1.0000 mg | INTRAMUSCULAR | Status: DC | PRN
  Administered 2013-01-06 – 2013-01-09 (×4): 1 mg via INTRAVENOUS
  Filled 2013-01-05 (×4): qty 1

## 2013-01-05 MED ORDER — EMTRICITABINE-TENOFOVIR DF 200-300 MG PO TABS
1.0000 | ORAL_TABLET | Freq: Every day | ORAL | Status: DC
  Administered 2013-01-05: 1 via ORAL
  Filled 2013-01-05 (×2): qty 1

## 2013-01-05 MED ORDER — SODIUM CHLORIDE 0.9 % IV BOLUS (SEPSIS)
1000.0000 mL | Freq: Once | INTRAVENOUS | Status: AC
  Administered 2013-01-05: 1000 mL via INTRAVENOUS

## 2013-01-05 MED ORDER — IOHEXOL 300 MG/ML  SOLN
100.0000 mL | Freq: Once | INTRAMUSCULAR | Status: AC | PRN
  Administered 2013-01-05: 100 mL via INTRAVENOUS

## 2013-01-05 MED ORDER — VALACYCLOVIR HCL 500 MG PO TABS
1000.0000 mg | ORAL_TABLET | Freq: Every day | ORAL | Status: DC
  Administered 2013-01-05: 1000 mg via ORAL
  Filled 2013-01-05 (×2): qty 2

## 2013-01-05 MED ORDER — RITONAVIR 100 MG PO CAPS
100.0000 mg | ORAL_CAPSULE | Freq: Every day | ORAL | Status: DC
  Administered 2013-01-05: 100 mg via ORAL
  Filled 2013-01-05 (×2): qty 1

## 2013-01-05 NOTE — ED Provider Notes (Signed)
Medical screening examination/treatment/procedure(s) were performed by non-physician practitioner and as supervising physician I was immediately available for consultation/collaboration.  Jaymon Dudek R. Calvin Jablonowski, MD 01/05/13 2152 

## 2013-01-05 NOTE — ED Notes (Signed)
Abcess left buttocks started draining today. Pt reports pain at 10/10 with difficulty walking and sit.

## 2013-01-05 NOTE — ED Notes (Signed)
Pt requesting food, aware on a NPO diet at this time.  Pt verbalized understanding

## 2013-01-05 NOTE — ED Notes (Addendum)
Pt will stay in triage 8 until a room is available in the back. Pt has difficulty sitting due to abscess on buttocks. Pt is lying down on stomach.

## 2013-01-05 NOTE — Progress Notes (Signed)
ANTIBIOTIC CONSULT NOTE - INITIAL  Pharmacy Consult for Vancomycin and Zosyn Indication: Peri-rectal abscess  No Known Allergies  Patient Measurements:   Wt=84.8 kg  Vital Signs: Temp: 99.3 F (37.4 C) (07/05 1904) Temp src: Oral (07/05 1904) BP: 103/56 mmHg (07/05 1904) Pulse Rate: 104 (07/05 1904) Intake/Output from previous day:   Intake/Output from this shift:    Labs:  Recent Labs  01/05/13 1715  WBC 16.2*  HGB 15.6  PLT 231  CREATININE 1.07   The CrCl is unknown because both a height and weight (above a minimum accepted value) are required for this calculation. No results found for this basename: VANCOTROUGH, VANCOPEAK, VANCORANDOM, GENTTROUGH, GENTPEAK, GENTRANDOM, TOBRATROUGH, TOBRAPEAK, TOBRARND, AMIKACINPEAK, AMIKACINTROU, AMIKACIN,  in the last 72 hours   Microbiology: No results found for this or any previous visit (from the past 720 hour(s)).  Medical History: Past Medical History  Diagnosis Date  . Immune deficiency disorder   . Cancer   . HIV infection   . Syphilis   . Gonorrhea   . Chlamydia   . Noncompliance   . Abnormal anal Papanicolaou smear   . Schizophrenia   . Bipolar disorder     Medications:  Scheduled:   Infusions:  . clindamycin (CLEOCIN) IV    . piperacillin-tazobactam    . [START ON 01/06/2013] piperacillin-tazobactam (ZOSYN)  IV    . sodium chloride    . vancomycin     Assessment: 22 yo male with history of HIV, syphilis, gonorrhea, chlamydia, schizophrenia and bipolar disorder c/o of abscess on the left buttock region.  Vancomycin and Zosyn per Rx ordered.  Goal of Therapy:  Vancomycin trough level 15-20 mcg/ml  Plan:   Zosyn 3.375 Gm IV q8h EI infusion  Vancomycin 1GM IV q8h. CrCl~108 (N)  F/U SCr/levels as needed.  [name] 01/05/2013,7:35 PM

## 2013-01-05 NOTE — H&P (Signed)
[facility] History and Physical  Kerney Hopfensperger AVW:[id number] DOB: [birth year] DOA: 01/05/2013  Referring physician: ER physician PCP: No primary provider on file.   Chief Complaint: rectal abscess, fever  HPI:  22 year old male with a past medical history of AIDS on HAART (last CD4 11/20/2012 330 with [place] [id number]), multiple STD's including chlamydia, gonorrhea and syphilis, bipolar disorder and schizophrenia who presented to [facility] ED with complaints of worsening rectal pain and developing abscess for past few days prior to this admission. Per patient he reports associated fever and chills at home. The abscess eventually bursted and drained pus. No reports of blood. Pain medications at home (advil) did not provide symptomatic relief. Patient also reported nausea and vomiting related to his rectal pain. No diarrhea. No chest pain, no shortness of breath, no palpitations. No lightheadedness or loss of consciousness. In ED, BP was 103/56, HR 104, Tmax 99.21F and O2 saturation 97 -100% on room air. CBC revealed leukocytosis of 16.2. BMP revealed hyponatremia of 129. CT pelvis was significant for  peripherally enhancing multiloculated 3.6 x 3.5 x 2.6 cm abscess in the superficial subcutaneous fat of the medial left buttock with extensive surrounding edema and inflammatory stranding. Rectal abscess was drained in ED. Patient was given 1 dose of clindamycin in ED.  Assessment and Plan:  Principal Problem:   Rectal abscess - drained in ED - started on clinda in ED but considering low grade fever and immunocompromised status I think it is reasonable to change antibiotics to broad spectrum, vanco and zosyn - obtain blood cultures, urinalysis and urine culture - follow up procalcitonin level - appreciate wound care consultation and recomendations Active Problems:   AIDS on HAART - CD4 count in 11/2012 330 - continue reyataz, truvada and norvir - since CD$ above 200 we can discontinue bactrim for PCP  prophylaxis   Hyponatremia - possible dehydration - continue IV fluids - follow up BMP in am   Asthma, chronic - stable - continue albuterol inhaler as needed  Code Status: Full Family Communication: Pt at bedside Disposition Plan: Admit for further evaluation  Manson Passey, MD  [facility] Pager [phone]  If 7PM-7AM, please contact night-coverage [email or web address] Password [email or web address] 01/05/2013, 7:24 PM  Review of Systems:   Constitutional: Negative for fever, chills and malaise/fatigue. Negative for diaphoresis.  HENT: Negative for hearing loss, ear pain, nosebleeds, congestion, sore throat, neck pain, tinnitus and ear discharge.   Eyes: Negative for blurred vision, double vision, photophobia, pain, discharge and redness.  Respiratory: Negative for cough, hemoptysis, sputum production, shortness of breath, wheezing and stridor.   Cardiovascular: Negative for chest pain, palpitations, orthopnea, claudication and leg swelling.  Gastrointestinal: Negative for nausea, vomiting and abdominal pain. Negative for heartburn, constipation, blood in stool and melena.  Genitourinary: Negative for dysuria, urgency, frequency, hematuria and flank pain.  Musculoskeletal: Negative for myalgias, back pain, joint pain and falls.  Skin: Negative for itching and rash. Positive for rectal abscess  Neurological: Negative for dizziness and weakness. Negative for tingling, tremors, sensory change, speech change, focal weakness, loss of consciousness and headaches.  Endo/Heme/Allergies: Negative for environmental allergies and polydipsia. Does not bruise/bleed easily.  Psychiatric/Behavioral: Negative for suicidal ideas. The patient is not nervous/anxious.      Past Medical History  Diagnosis Date  . Immune deficiency disorder   . Cancer   . HIV infection   . Syphilis   . Gonorrhea   . Chlamydia   . Noncompliance   . Abnormal anal Papanicolaou smear   .  Schizophrenia   . Bipolar disorder    Past Surgical  History  Procedure Laterality Date  . Eye surgery     Social History:  reports that he has been smoking Cigarettes.  He has been smoking about 0.30 packs per day. He has never used smokeless tobacco. He reports that he drinks about 2.5 ounces of alcohol per week. He reports that he uses illicit drugs (Marijuana) about 3 times per week.  No Known Allergies  Family History:  Family History  Problem Relation Age of Onset  . Hypertension Mother      Prior to Admission medications   Medication Sig Start Date End Date Taking? Authorizing Provider  albuterol (PROVENTIL HFA;VENTOLIN HFA) 108 (90 BASE) MCG/ACT inhaler Inhale 2 puffs into the lungs every 6 (six) hours as needed for wheezing. 12/17/12   [name], MD  atazanavir (REYATAZ) 300 MG capsule Take 1 capsule (300 mg total) by mouth daily. 12/31/12   [name], MD  emtricitabine-tenofovir (TRUVADA) 200-300 MG per tablet Take 1 tablet by mouth daily. 12/31/12   [name], MD  ibuprofen (ADVIL,MOTRIN) 600 MG tablet Take 1 tablet (600 mg total) by mouth every 6 (six) hours as needed for pain. 12/30/12   Renne Crigler, PA-C  methocarbamol (ROBAXIN) 500 MG tablet Take 2 tablets (1,000 mg total) by mouth 4 (four) times daily. 12/30/12   Renne Crigler, PA-C  ritonavir (NORVIR) 100 MG capsule Take 1 capsule (100 mg total) by mouth daily. 12/31/12   [name], MD  sulfamethoxazole-trimethoprim (BACTRIM DS) 800-160 MG per tablet Take 2 tablets by mouth 2 (two) times daily. 12/17/12   [name], MD  triamcinolone ointment (KENALOG) 0.5 % Apply topically 2 (two) times daily. 12/17/12   [name], MD  valACYclovir (VALTREX) 1000 MG tablet Take 1,000 mg by mouth daily.      Historical Provider, MD   Physical Exam: Filed Vitals:   01/05/13 1548 01/05/13 1904  BP: 115/70 103/56  Pulse: 143 104  Temp: 99.4 F (37.4 C) 99.3 F (37.4 C)  TempSrc: Oral Oral  Resp: 16 18  SpO2: 100% 97%    Physical  Exam  Constitutional: Appears well-developed and well-nourished. No distress.  HENT: Normocephalic. External right and left ear normal. Oropharynx is clear and moist.  Eyes: Conjunctivae and EOM are normal. PERRLA, no scleral icterus.  Neck: Normal ROM. Neck supple. No JVD. No tracheal deviation. No thyromegaly.  CVS: RRR, S1/S2 +, no murmurs, no gallops, no carotid bruit.  Pulmonary: Effort and breath sounds normal, no stridor, rhonchi, wheezes, rales.  Abdominal: Soft. BS +,  no distension, tenderness, rebound or guarding.  Musculoskeletal: Normal range of motion. No edema and no tenderness.  Lymphadenopathy: No lymphadenopathy noted, cervical, inguinal. Neuro: Alert. Normal reflexes, muscle tone coordination. No cranial nerve deficit. Skin: rectal abscess 3 x 1 inch in diameter with brown foul smelling odor  Psychiatric: Normal mood and affect. Behavior, judgment, thought content normal.   Labs on Admission:  Basic Metabolic Panel:  Recent Labs Lab 01/05/13 1715  NA 129*  K 3.9  CL 95*  CO2 26  GLUCOSE 83  BUN 16  CREATININE 1.07  CALCIUM 9.6   Liver Function Tests: No results found for this basename: AST, ALT, ALKPHOS, BILITOT, PROT, ALBUMIN,  in the last 168 hours No results found for this basename: LIPASE, AMYLASE,  in the last 168 hours No results found for this basename: AMMONIA,  in the last 168 hours CBC:  Recent Labs Lab 01/05/13 1715  WBC 16.2*  HGB 15.6  HCT 44.2  MCV 85.5  PLT 231   Cardiac Enzymes: No results found for this basename: CKTOTAL, CKMB, CKMBINDEX, TROPONINI,  in the last 168 hours BNP: No components found with this basename: POCBNP,  CBG: No results found for this basename: GLUCAP,  in the last 168 hours  Radiological Exams on Admission: Ct Pelvis W Contrast 01/05/2013   * IMPRESSION:  1.  Peripherally enhancing multiloculated 3.6 x 3.5 x 2.6 cm abscess in the superficial subcutaneous fat of the medial left buttock with extensive  surrounding edema and inflammatory stranding.  No involvement of the left gluteal muscles. This secondary inflammatory stranding ascends to and abuts the anal verge.  Minimal stranding extends into the left ischiorectal fossa.  2.  Circumferential thickening of the distal rectum/anal verge may be related to the underlying infectious process or represent primary anoproctitis, hemorrhoids, or verrucae given the clinical history.   Original Report Authenticated By: Malachy Moan, M.D.   Time spent: 75 minutes

## 2013-01-05 NOTE — ED Provider Notes (Signed)
History    CSN: [id number] Arrival date & time 01/05/13  1438  First MD Initiated Contact with Patient 01/05/13 1639     No chief complaint on file.  (Consider location/radiation/quality/duration/timing/severity/associated sxs/prior Treatment) HPI Comments: 22 year old male with a past medical history of HIV, syphilis, gonorrhea, Chlamydia, schizophrenia and bipolar disorder presents to the emergency department complaining of an abscess to the left side of his buttock region beginning 2 days ago, increasing in size until arriving at the emergency department when the abscess "busted" causing pus to come out. States the area is very painful, rated 10 out of 10, radiating around his rectal area. Admits to associated nausea, subjective fever and chills. He has not had any alleviating factors for his pain. States he has been off HIV medications for the past couple days as they have not yet arrived at his grandmother's house.  The history is provided by the patient.   Past Medical History  Diagnosis Date  . Immune deficiency disorder   . Cancer   . HIV infection   . Syphilis   . Gonorrhea   . Chlamydia   . Noncompliance   . Abnormal anal Papanicolaou smear   . Schizophrenia   . Bipolar disorder    Past Surgical History  Procedure Laterality Date  . Eye surgery     Family History  Problem Relation Age of Onset  . Hypertension Mother    History  Substance Use Topics  . Smoking status: Current Every Day Smoker -- 0.30 packs/day    Types: Cigarettes  . Smokeless tobacco: Never Used     Comment: cutting back  . Alcohol Use: 2.5 oz/week    5 drink(s) per week     Comment: socially    Review of Systems  Constitutional: Positive for fever and chills.  Gastrointestinal: Positive for nausea, vomiting and rectal pain.  Skin:       Positive for abscess.  All other systems reviewed and are negative.    Allergies  Review of patient's allergies indicates no known  allergies.  Home Medications   Current Outpatient Rx  Name  Route  Sig  Dispense  Refill  . albuterol (PROVENTIL HFA;VENTOLIN HFA) 108 (90 BASE) MCG/ACT inhaler   Inhalation   Inhale 2 puffs into the lungs every 6 (six) hours as needed for wheezing.   1 Inhaler   6   . atazanavir (REYATAZ) 300 MG capsule   Oral   Take 1 capsule (300 mg total) by mouth daily.   30 capsule   5   . emtricitabine-tenofovir (TRUVADA) 200-300 MG per tablet   Oral   Take 1 tablet by mouth daily.   30 tablet   5   . ibuprofen (ADVIL,MOTRIN) 600 MG tablet   Oral   Take 1 tablet (600 mg total) by mouth every 6 (six) hours as needed for pain.   20 tablet   0   . methocarbamol (ROBAXIN) 500 MG tablet   Oral   Take 2 tablets (1,000 mg total) by mouth 4 (four) times daily.   20 tablet   0   . ritonavir (NORVIR) 100 MG capsule   Oral   Take 1 capsule (100 mg total) by mouth daily.   30 capsule   5   . sulfamethoxazole-trimethoprim (BACTRIM DS) 800-160 MG per tablet   Oral   Take 2 tablets by mouth 2 (two) times daily.   40 tablet   1   . triamcinolone ointment (KENALOG) 0.5 %  Topical   Apply topically 2 (two) times daily.   30 g   2   . valACYclovir (VALTREX) 1000 MG tablet   Oral   Take 1,000 mg by mouth daily.            BP 115/70  Pulse 143  Temp(Src) 99.4 F (37.4 C) (Oral)  Resp 16  SpO2 100% Physical Exam  Nursing note and vitals reviewed. Constitutional: He is oriented to person, place, and time. He appears well-developed and well-nourished. No distress.  HENT:  Head: Normocephalic and atraumatic.  Mouth/Throat: Oropharynx is clear and moist.  Eyes: Conjunctivae are normal.  Neck: Normal range of motion. Neck supple.  Cardiovascular: Regular rhythm and normal heart sounds.  Tachycardia present.   Pulmonary/Chest: Breath sounds normal. No respiratory distress.  Abdominal: Soft. Bowel sounds are normal. There is no tenderness.  Musculoskeletal: Normal range of  motion. He exhibits no edema.  Neurological: He is alert and oriented to person, place, and time.  Skin: Skin is warm and dry. He is not diaphoretic.     Psychiatric: He has a normal mood and affect. His behavior is normal.    ED Course  Procedures (including critical care time) INCISION AND DRAINAGE Performed by: Johnnette Gourd Consent: Verbal consent obtained. Risks and benefits: risks, benefits and alternatives were discussed Type: abscess  Body area: left buttock  Anesthesia: local infiltration  Incision was made with a scalpel.  Local anesthetic: lidocaine 2% without epinephrine  Anesthetic total: 10 ml  Complexity: complex Blunt dissection to break up loculations, suction  Drainage: purulent, foul, brown  Drainage amount: large   Patient tolerance: Patient tolerated the procedure well with no immediate complications.    Labs Reviewed - No data to display Ct Pelvis W Contrast  01/05/2013   *RADIOLOGY REPORT*  Clinical Data:  Left block pain/draining abscess.  History of HIV.  CT PELVIS WITH CONTRAST  Technique:  Multidetector CT imaging of the pelvis was performed using the standard protocol following the bolus administration of intravenous contrast.  Contrast: OMNIPAQUE IOHEXOL 300 MG/ML  SOLN  Comparison:  Previous CT abdomen/pelvis 08/27/2004  Findings:  Peripherally enhancing multiloculated 3.6 x 3.5 x 2.6 cm fluid collection within the superficial subcutaneous fat of the medial left buttock consistent with abscess.  There is extensive inflammatory stranding in the adjacent adipose tissue. The adjacent gluteal muscles are spared.  The stranding ascends and abuts the anal verge.  Additionally, there is some circumferential thickening the distal rectum and anus.  Minimal inflammatory stranding noted in the left ischiorectal fossa.  No definite extension of the abscess into the ischiorectal fossa.  Visualized abdominal organs are unremarkable.  Normal appendix in the  right lower quadrant. No acute fracture or aggressive appearing lytic or blastic osseous lesion.  IMPRESSION:  1.  Peripherally enhancing multiloculated 3.6 x 3.5 x 2.6 cm abscess in the superficial subcutaneous fat of the medial left buttock with extensive surrounding edema and inflammatory stranding.  No involvement of the left gluteal muscles. This secondary inflammatory stranding ascends to and abuts the anal verge.  Minimal stranding extends into the left ischiorectal fossa.  2.  Circumferential thickening of the distal rectum/anal verge may be related to the underlying infectious process or represent primary anoproctitis, hemorrhoids, or verrucae given the clinical history.   Original Report Authenticated By: Malachy Moan, M.D.   1. Abscess   2. Cellulitis     MDM  Patient with abscess to left buttock, close proximity to rectum, extreme tenderness,  nausea, vomiting, rectal pain. Hx of HIV with poor compliance. Concern for peri-rectal abscess. Obtaining CT pelvis, cbc, bmp. Dilaudid for pain, zofran for nausea. 6:43 PM CT scan with results as shown above. No perirectal abscess. Abscess drained using suction, large amount of foul smelling brown pus expressed. Patient tachycardic, normotensive, normal respirations. IV clinda. Fluids, dilaudid for pain. Patient discussed with Dr. Rubin Payor. Will admit patient to medicine. Patient is immunocompromised. 7:09 PM Admission accepted by Dr. Elisabeth Pigeon, [facility] team 8.  Trevor Mace, PA-C 01/05/13 1909

## 2013-01-06 DIAGNOSIS — E871 Hypo-osmolality and hyponatremia: Secondary | ICD-10-CM

## 2013-01-06 DIAGNOSIS — Z21 Asymptomatic human immunodeficiency virus [HIV] infection status: Secondary | ICD-10-CM

## 2013-01-06 DIAGNOSIS — E876 Hypokalemia: Secondary | ICD-10-CM

## 2013-01-06 DIAGNOSIS — K612 Anorectal abscess: Secondary | ICD-10-CM

## 2013-01-06 LAB — COMPREHENSIVE METABOLIC PANEL
ALT: 7 U/L (ref 0–53)
AST: 13 U/L (ref 0–37)
CO2: 26 mEq/L (ref 19–32)
Chloride: 97 mEq/L (ref 96–112)
GFR calc non Af Amer: >90 mL/min (ref >90)
Potassium: 3.3 mEq/L — ABNORMAL LOW (ref 3.5–5.1)
Sodium: 131 mEq/L — ABNORMAL LOW (ref 135–145)
Total Bilirubin: 2.1 mg/dL — ABNORMAL HIGH (ref 0.3–1.2)

## 2013-01-06 LAB — URINALYSIS, ROUTINE W REFLEX MICROSCOPIC
Glucose, UA: NEGATIVE mg/dL
Specific Gravity, Urine: 1.045 — ABNORMAL HIGH (ref 1.005–1.030)
pH: 6 (ref 5.0–8.0)

## 2013-01-06 LAB — CBC
Hemoglobin: 13.2 g/dL (ref 13.0–17.0)
MCH: 29.1 pg (ref 26.0–34.0)
MCHC: 33.8 g/dL (ref 30.0–36.0)
Platelets: 229 10*3/uL (ref 150–400)

## 2013-01-06 LAB — URINE MICROSCOPIC-ADD ON

## 2013-01-06 MED ORDER — EMTRICITABINE-TENOFOVIR DF 200-300 MG PO TABS
1.0000 | ORAL_TABLET | Freq: Every day | ORAL | Status: DC
Start: 2013-01-06 — End: 2013-01-06
  Filled 2013-01-06: qty 1

## 2013-01-06 MED ORDER — NAPROXEN 500 MG PO TABS
500.0000 mg | ORAL_TABLET | Freq: Two times a day (BID) | ORAL | Status: DC
  Administered 2013-01-06 – 2013-01-10 (×8): 500 mg via ORAL
  Filled 2013-01-06 (×10): qty 1

## 2013-01-06 MED ORDER — EMTRICITABINE-TENOFOVIR DF 200-300 MG PO TABS
1.0000 | ORAL_TABLET | Freq: Every day | ORAL | Status: DC
  Administered 2013-01-06 – 2013-01-09 (×4): 1 via ORAL
  Filled 2013-01-06 (×5): qty 1

## 2013-01-06 MED ORDER — DIPHENHYDRAMINE HCL 25 MG PO CAPS
25.0000 mg | ORAL_CAPSULE | Freq: Once | ORAL | Status: AC
  Administered 2013-01-06: 25 mg via ORAL
  Filled 2013-01-06: qty 1

## 2013-01-06 MED ORDER — POTASSIUM CHLORIDE CRYS ER 20 MEQ PO TBCR
40.0000 meq | EXTENDED_RELEASE_TABLET | Freq: Once | ORAL | Status: AC
  Administered 2013-01-06: 40 meq via ORAL
  Filled 2013-01-06: qty 2

## 2013-01-06 MED ORDER — SODIUM CHLORIDE 0.9 % IV SOLN
3.0000 g | Freq: Four times a day (QID) | INTRAVENOUS | Status: DC
  Administered 2013-01-06 – 2013-01-09 (×12): 3 g via INTRAVENOUS
  Filled 2013-01-06 (×13): qty 3

## 2013-01-06 MED ORDER — VALACYCLOVIR HCL 500 MG PO TABS
1000.0000 mg | ORAL_TABLET | Freq: Every day | ORAL | Status: DC
  Filled 2013-01-06: qty 2

## 2013-01-06 MED ORDER — ATAZANAVIR SULFATE 150 MG PO CAPS
300.0000 mg | ORAL_CAPSULE | Freq: Every day | ORAL | Status: DC
  Administered 2013-01-06 – 2013-01-09 (×4): 300 mg via ORAL
  Filled 2013-01-06 (×5): qty 2

## 2013-01-06 MED ORDER — OXYCODONE HCL 5 MG PO TABS
5.0000 mg | ORAL_TABLET | ORAL | Status: DC | PRN
  Administered 2013-01-06 – 2013-01-07 (×3): 10 mg via ORAL
  Administered 2013-01-07: 5 mg via ORAL
  Administered 2013-01-08 – 2013-01-09 (×4): 10 mg via ORAL
  Filled 2013-01-06 (×6): qty 2
  Filled 2013-01-06: qty 1
  Filled 2013-01-06: qty 2

## 2013-01-06 MED ORDER — IBUPROFEN 600 MG PO TABS
600.0000 mg | ORAL_TABLET | Freq: Three times a day (TID) | ORAL | Status: DC
  Administered 2013-01-06: 600 mg via ORAL
  Filled 2013-01-06 (×4): qty 1

## 2013-01-06 MED ORDER — POLYETHYLENE GLYCOL 3350 17 G PO PACK
17.0000 g | PACK | Freq: Two times a day (BID) | ORAL | Status: DC
  Administered 2013-01-06 – 2013-01-07 (×4): 17 g via ORAL

## 2013-01-06 MED ORDER — RITONAVIR 100 MG PO TABS
100.0000 mg | ORAL_TABLET | Freq: Every day | ORAL | Status: DC
  Administered 2013-01-06 – 2013-01-09 (×4): 100 mg via ORAL
  Filled 2013-01-06 (×5): qty 1

## 2013-01-06 MED ORDER — VALACYCLOVIR HCL 500 MG PO TABS
1000.0000 mg | ORAL_TABLET | Freq: Every day | ORAL | Status: DC
  Administered 2013-01-06 – 2013-01-09 (×4): 1000 mg via ORAL
  Filled 2013-01-06 (×5): qty 2
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
## 2013-01-06 NOTE — Consult Note (Addendum)
[facility]    Date of Admission:  01/05/2013  Date of Consult:  01/06/2013  Reason for Consult: perirectal abscess Referring Physician: Dr. [name]   HPI: [name] is an 22 y.o. male with HIV, recently seen by myself in ID clinic in June 16th. He was supposed to restart Reyataz, norvir and truvada but informs me that he still had not yet been able to receive ARV due to hold up in ADAP. He had furuncle at that time when I saw him in clinic and I had started him  On Bactrim BID. In the interim this abscess grew in size and then burst at home with purulent drainage. Family urged him to come to the ED and he has been admitted to [facility] and seen by Dr Michaell Cowing with [facility]. He still has great deal of pain at the site. CT has shown 3.5 x 3.6 x2.5 cm abscess along with some peri-rectal thickening on CT.   Past Medical History  Diagnosis Date  . Immune deficiency disorder   . Cancer   . HIV infection   . Syphilis   . Gonorrhea   . Chlamydia   . Noncompliance   . Abnormal anal Papanicolaou smear   . Schizophrenia   . Bipolar disorder     Past Surgical History  Procedure Laterality Date  . Eye surgery    ergies:   No Known Allergies   Medications: I have reviewed patients current medications as documented in Epic Anti-infectives   Start     Dose/Rate Route Frequency Ordered Stop   01/06/13 1700  atazanavir (REYATAZ) capsule 300 mg     300 mg Oral Daily with supper 01/06/13 1054     01/06/13 1700  ritonavir (NORVIR) tablet 100 mg     100 mg Oral Daily with supper 01/06/13 1054     01/06/13 1506  emtricitabine-tenofovir (TRUVADA) 200-300 MG per tablet 1 tablet     1 tablet Oral Daily with supper 01/06/13 1507     01/06/13 1448  valACYclovir (VALTREX) tablet 1,000 mg     1,000 mg Oral Daily with supper 01/06/13 1445     01/06/13 1200  emtricitabine-tenofovir (TRUVADA) 200-300 MG per tablet 1 tablet  Status:  Discontinued     1 tablet Oral Daily 01/06/13  1054 01/06/13 1507   01/06/13 1200  valACYclovir (VALTREX) tablet 1,000 mg  Status:  Discontinued     1,000 mg Oral Daily 01/06/13 1054 01/06/13 1445   01/06/13 1200  Ampicillin-Sulbactam (UNASYN) 3 g in sodium chloride 0.9 % 100 mL IVPB     3 g 100 mL/hr over 60 Minutes Intravenous Every 6 hours 01/06/13 1133     01/06/13 0400  piperacillin-tazobactam (ZOSYN) IVPB 3.375 g  Status:  Discontinued     3.375 g 12.5 mL/hr over 240 Minutes Intravenous 3 times per day 01/05/13 1933 01/06/13 1129   01/05/13 2130  atazanavir (REYATAZ) capsule 300 mg  Status:  Discontinued     300 mg Oral Daily with breakfast 01/05/13 2059 01/06/13 1054   01/05/13 2130  emtricitabine-tenofovir (TRUVADA) 200-300 MG per tablet 1 tablet  Status:  Discontinued     1 tablet Oral Daily 01/05/13 2059 01/06/13 1054   01/05/13 2130  ritonavir (NORVIR) capsule 100 mg  Status:  Discontinued     100 mg Oral Daily with breakfast 01/05/13 2059 01/06/13 1054   01/05/13 2130  valACYclovir (VALTREX) tablet 1,000 mg  Status:  Discontinued     1,000  mg Oral Daily 01/05/13 2059 01/06/13 1054   01/05/13 2000  vancomycin (VANCOCIN) IVPB 1000 mg/200 mL premix     1,000 mg 200 mL/hr over 60 Minutes Intravenous Every 8 hours 01/05/13 1927     01/05/13 1945  piperacillin-tazobactam (ZOSYN) IVPB 3.375 g     3.375 g 100 mL/hr over 30 Minutes Intravenous  Once 01/05/13 1927 01/05/13 2041   01/05/13 1815  clindamycin (CLEOCIN) IVPB 600 mg     600 mg 100 mL/hr over 30 Minutes Intravenous  Once 01/05/13 1814        Social History:  reports that he has been smoking Cigarettes.  He has been smoking about 0.30 packs per day. He has never used smokeless tobacco. He reports that he drinks about 2.5 ounces of alcohol per week. He reports that he uses illicit drugs (Marijuana) about 3 times per week.  Family History  Problem Relation Age of Onset  . Hypertension Mother     As in HPI and primary teams notes otherwise 12 point review of systems  is negative  Blood pressure 113/71, pulse 62, temperature 97.7 F (36.5 C), temperature source Oral, resp. rate 17, height 6\' 4"  (1.93 m), weight 187 lb (84.823 kg), SpO2 98.00%. General: Alert and awake, oriented x3, not in any acute distress. HEENT: anicteric sclera, pupils reactive to light and accommodation, EOMI, oropharynx clear and without exudate CVS regular rate, normal r,  no murmur rubs or gallops Chest: clear to auscultation bilaterally, no wheezing, rales or rhonchi Abdomen: soft nontender, nondistended, normal bowel sounds, Extremities: no  clubbing or edema noted bilaterally Skin: exquisitely tender perirectal area of induration, difficult to examine Neuro: nonfocal, strength and sensation intact   Results for orders placed during the hospital encounter of 01/05/13 (from the past 48 hour(s))  CBC     Status: Abnormal   Collection Time    01/05/13  5:15 PM      Result Value Range   WBC 16.2 (*) 4.0 - 10.5 K/uL   RBC 5.17  4.22 - 5.81 MIL/uL   Hemoglobin 15.6  13.0 - 17.0 g/dL   HCT 08.6  57.8 - 46.9 %   MCV 85.5  78.0 - 100.0 fL   MCH 30.2  26.0 - 34.0 pg   MCHC 35.3  30.0 - 36.0 g/dL   RDW 62.9  52.8 - 41.3 %   Platelets 231  150 - 400 K/uL  BASIC METABOLIC PANEL     Status: Abnormal   Collection Time    01/05/13  5:15 PM      Result Value Range   Sodium 129 (*) 135 - 145 mEq/L   Potassium 3.9  3.5 - 5.1 mEq/L   Chloride 95 (*) 96 - 112 mEq/L   CO2 26  19 - 32 mEq/L   Glucose, Bld 83  70 - 99 mg/dL   BUN 16  6 - 23 mg/dL   Creatinine, Ser 2.44  0.50 - 1.35 mg/dL   Calcium 9.6  8.4 - 01.0 mg/dL   GFR calc non Af Amer >90  >90 mL/min   GFR calc Af Amer >90  >90 mL/min   Comment:            The eGFR has been calculated     using the CKD EPI equation.     This calculation has not been     validated in all clinical     situations.     eGFR's persistently     <90  mL/min signify     possible Chronic Kidney Disease.  PROCALCITONIN     Status: None    Collection Time    01/05/13  8:00 PM      Result Value Range   Procalcitonin 0.20     Comment:            Interpretation:     PCT (Procalcitonin) <= 0.5 ng/mL:     Systemic infection (sepsis) is not likely.     Local bacterial infection is possible.     (NOTE)             ICU PCT Algorithm               Non ICU PCT Algorithm        ----------------------------     ------------------------------             PCT < 0.25 ng/mL                 PCT < 0.1 ng/mL         Stopping of antibiotics            Stopping of antibiotics           strongly encouraged.               strongly encouraged.        ----------------------------     ------------------------------           PCT level decrease by               PCT < 0.25 ng/mL           >= 80% from peak PCT           OR PCT 0.25 - 0.5 ng/mL          Stopping of antibiotics                                                 encouraged.         Stopping of antibiotics               encouraged.        ----------------------------     ------------------------------           PCT level decrease by              PCT >= 0.25 ng/mL           < 80% from peak PCT            AND PCT >= 0.5 ng/mL            Continuing antibiotics                                                  encouraged.           Continuing antibiotics                encouraged.        ----------------------------     ------------------------------         PCT level increase compared          PCT > 0.5 ng/mL  with peak PCT AND              PCT >= 0.5 ng/mL             Escalation of antibiotics                                              strongly encouraged.          Escalation of antibiotics            strongly encouraged.  GLUCOSE, CAPILLARY     Status: None   Collection Time    01/05/13  9:15 PM      Result Value Range   Glucose-Capillary 82  70 - 99 mg/dL   Comment 1 Notify RN    URINALYSIS, ROUTINE W REFLEX MICROSCOPIC     Status: Abnormal   Collection Time     01/06/13  2:22 AM      Result Value Range   Color, Urine AMBER (*) YELLOW   Comment: BIOCHEMICALS MAY BE AFFECTED BY COLOR   APPearance CLEAR  CLEAR   Specific Gravity, Urine 1.045 (*) 1.005 - 1.030   pH 6.0  5.0 - 8.0   Glucose, UA NEGATIVE  NEGATIVE mg/dL   Hgb urine dipstick TRACE (*) NEGATIVE   Bilirubin Urine NEGATIVE  NEGATIVE   Ketones, ur NEGATIVE  NEGATIVE mg/dL   Protein, ur NEGATIVE  NEGATIVE mg/dL   Urobilinogen, UA 1.0  0.0 - 1.0 mg/dL   Nitrite NEGATIVE  NEGATIVE   Leukocytes, UA NEGATIVE  NEGATIVE  URINE MICROSCOPIC-ADD ON     Status: Abnormal   Collection Time    01/06/13  2:22 AM      Result Value Range   Squamous Epithelial / LPF RARE  RARE   WBC, UA 0-2  <3 WBC/hpf   RBC / HPF 3-6  <3 RBC/hpf   Bacteria, UA RARE  RARE   Casts HYALINE CASTS (*) NEGATIVE  COMPREHENSIVE METABOLIC PANEL     Status: Abnormal   Collection Time    01/06/13  5:06 AM      Result Value Range   Sodium 131 (*) 135 - 145 mEq/L   Potassium 3.3 (*) 3.5 - 5.1 mEq/L   Chloride 97  96 - 112 mEq/L   CO2 26  19 - 32 mEq/L   Glucose, Bld 106 (*) 70 - 99 mg/dL   BUN 13  6 - 23 mg/dL   Creatinine, Ser 1.61  0.50 - 1.35 mg/dL   Calcium 8.7  8.4 - 09.6 mg/dL   Total Protein 8.4 (*) 6.0 - 8.3 g/dL   Albumin 2.9 (*) 3.5 - 5.2 g/dL   AST 13  0 - 37 U/L   ALT 7  0 - 53 U/L   Alkaline Phosphatase 63  39 - 117 U/L   Total Bilirubin 2.1 (*) 0.3 - 1.2 mg/dL   GFR calc non Af Amer >90  >90 mL/min   GFR calc Af Amer >90  >90 mL/min   Comment:            The eGFR has been calculated     using the CKD EPI equation.     This calculation has not been     validated in all clinical     situations.     eGFR's persistently     <90 mL/min signify  possible Chronic Kidney Disease.  CBC     Status: Abnormal   Collection Time    01/06/13  5:06 AM      Result Value Range   WBC 12.6 (*) 4.0 - 10.5 K/uL   RBC 4.53  4.22 - 5.81 MIL/uL   Hemoglobin 13.2  13.0 - 17.0 g/dL   HCT 16.1  09.6 - 04.5 %   MCV  86.1  78.0 - 100.0 fL   MCH 29.1  26.0 - 34.0 pg   MCHC 33.8  30.0 - 36.0 g/dL   RDW 40.9  81.1 - 91.4 %   Platelets 229  150 - 400 K/uL      Component Value Date/Time   SDES URINE, CLEAN CATCH 06/01/2011 2149   SPECREQUEST Immunocompromised 06/01/2011 2149   CULT NO GROWTH 06/01/2011 2149   REPTSTATUS 06/03/2011 FINAL 06/01/2011 2149   Ct Pelvis W Contrast  01/05/2013   *RADIOLOGY REPORT*  Clinical Data:  Left block pain/draining abscess.  History of HIV.  CT PELVIS WITH CONTRAST  Technique:  Multidetector CT imaging of the pelvis was performed using the standard protocol following the bolus administration of intravenous contrast.  Contrast: OMNIPAQUE IOHEXOL 300 MG/ML  SOLN  Comparison:  Previous CT abdomen/pelvis 08/27/2004  Findings:  Peripherally enhancing multiloculated 3.6 x 3.5 x 2.6 cm fluid collection within the superficial subcutaneous fat of the medial left buttock consistent with abscess.  There is extensive inflammatory stranding in the adjacent adipose tissue. The adjacent gluteal muscles are spared.  The stranding ascends and abuts the anal verge.  Additionally, there is some circumferential thickening the distal rectum and anus.  Minimal inflammatory stranding noted in the left ischiorectal fossa.  No definite extension of the abscess into the ischiorectal fossa.  Visualized abdominal organs are unremarkable.  Normal appendix in the right lower quadrant. No acute fracture or aggressive appearing lytic or blastic osseous lesion.  IMPRESSION:  1.  Peripherally enhancing multiloculated 3.6 x 3.5 x 2.6 cm abscess in the superficial subcutaneous fat of the medial left buttock with extensive surrounding edema and inflammatory stranding.  No involvement of the left gluteal muscles. This secondary inflammatory stranding ascends to and abuts the anal verge.  Minimal stranding extends into the left ischiorectal fossa.  2.  Circumferential thickening of the distal rectum/anal verge may be  related to the underlying infectious process or represent primary anoproctitis, hemorrhoids, or verrucae given the clinical history.   Original Report Authenticated By: Malachy Moan, M.D.     No results found for this or any previous visit (from the past 720 hour(s)).   Impression/Recommendation  #1 Perirectal abscess: --would narrow to Vancomycin and unasyn --[facility] following  #2 HIV: continue reyataz norvir and truvada  #3 Peirectal thickening:   Could obtain swab for GC and Chlamydia by gen probe,   Thank you so much for this interesting consult  Dr. Ninetta Lights is here in the am.  [facility] [phone] (pager) [phone] (office) 01/06/2013, 5:59 PM  Paulette Blanch Dam 01/06/2013, 5:59 PM

## 2013-01-06 NOTE — Progress Notes (Signed)
Pt c/o itching. Last dose Oxy 2100. No C/o itching last pm or throughout day. Alpha Gula, NP on call. Benadryl ordered. 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

## 2013-01-06 NOTE — Consult Note (Signed)
WOC consult Note Reason for Consult:perirectal abscess.  Patient has been seen by Dr. Michaell Cowing and is scheduled for surgical procedure in am.  No dressing orders, I will implement conservative care orders until surgery. Wound type:infectious Pressure Ulcer POA: No Dressing procedure/placement/frequency:I will initiate orders for saline dressings to the area twice daily until surgical procedure tomorrow. I will not follow, but will remain available to this patient, his nursing and surgical teams.  Please re-consult if needed. Thanks, Ladona Mow, MSN, RN, GNP, [place], CWON-AP [phone])
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
## 2013-01-07 ENCOUNTER — Inpatient Hospital Stay (HOSPITAL_COMMUNITY): Payer: Self-pay

## 2013-01-07 LAB — CBC
Platelets: 208 10*3/uL (ref 150–400)
RBC: 4.42 MIL/uL (ref 4.22–5.81)
RDW: 13.2 % (ref 11.5–15.5)
WBC: 5.9 10*3/uL (ref 4.0–10.5)

## 2013-01-07 LAB — COMPREHENSIVE METABOLIC PANEL
ALT: 8 U/L (ref 0–53)
AST: 12 U/L (ref 0–37)
Albumin: 2.7 g/dL — ABNORMAL LOW (ref 3.5–5.2)
CO2: 30 mEq/L (ref 19–32)
Calcium: 8.4 mg/dL (ref 8.4–10.5)
Chloride: 103 mEq/L (ref 96–112)
GFR calc non Af Amer: >90 mL/min (ref >90)
Sodium: 136 mEq/L (ref 135–145)
Total Bilirubin: 1.8 mg/dL — ABNORMAL HIGH (ref 0.3–1.2)

## 2013-01-07 LAB — VANCOMYCIN, TROUGH: Vancomycin Tr: 20.6 ug/mL — ABNORMAL HIGH (ref 10.0–20.0)

## 2013-01-07 LAB — URINE CULTURE: Colony Count: NO GROWTH

## 2013-01-07 MED ORDER — DOCUSATE SODIUM 100 MG PO CAPS
100.0000 mg | ORAL_CAPSULE | Freq: Two times a day (BID) | ORAL | Status: DC
  Administered 2013-01-07 – 2013-01-08 (×2): 100 mg via ORAL
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
## 2013-01-07 NOTE — Progress Notes (Signed)
Patient interviewed and examined, agree with PA note above. He is quite tender around the 2 I&D sites but there is no significant induration or erythema or fluctuance. His white blood count has normalized. I do not see evidence for undrained infection by physical exam. We will continue antibiotics and observation for now.  Mariella Saa MD, FACS  01/07/2013 1:09 PM

## 2013-01-07 NOTE — Progress Notes (Signed)
ANTIBIOTIC CONSULT NOTE - FOLLOW UP  Pharmacy Consult for Vancomycin Indication: Rectal Abscess  No Known Allergies  Patient Measurements: Height: 6\' 4"  (193 cm) Weight: 187 lb (84.823 kg) IBW/kg (Calculated) : 86.8  Vital Signs: Temp: 98.3 F (36.8 C) (07/07 0601) Temp src: Oral (07/07 0601) BP: 122/67 mmHg (07/07 0601) Pulse Rate: 43 (07/07 0601) Intake/Output from previous day: 07/06 0701 - 07/07 0700 In: 3116.3 [P.O.:540; I.V.:1576.3; IV Piggyback:1000] Out: 701 [Urine:700; Emesis/NG output:1] Intake/Output from this shift:    Labs:  Recent Labs  01/05/13 1715 01/06/13 0506 01/07/13 0440  WBC 16.2* 12.6* 5.9  HGB 15.6 13.2 13.1  PLT 231 229 208  CREATININE 1.07 0.97 0.91   Estimated Creatinine Clearance: 150.1 ml/min (by C-G formula based on Cr of 0.91).  Recent Labs  01/07/13 1045  VANCOTROUGH 20.6*     Microbiology: Recent Results (from the past 720 hour(s))  CULTURE, BLOOD (ROUTINE X 2)     Status: None   Collection Time    01/05/13  8:00 PM      Result Value Range Status   Specimen Description BLOOD RIGHT FOREARM  4 ML IN [facility] BOTTLE   Final   Special Requests NONE   Final   Culture  Setup Time 01/06/2013 03:51   Final   Culture     Final   Value:        BLOOD CULTURE RECEIVED NO GROWTH TO DATE CULTURE WILL BE HELD FOR 5 DAYS BEFORE ISSUING A FINAL NEGATIVE REPORT   Report Status PENDING   Incomplete  CULTURE, BLOOD (ROUTINE X 2)     Status: None   Collection Time    01/05/13  8:05 PM      Result Value Range Status   Specimen Description BLOOD RIGHT ARM  5 ML IN [facility] BOTTLE   Final   Special Requests NONE   Final   Culture  Setup Time 01/06/2013 03:51   Final   Culture     Final   Value:        BLOOD CULTURE RECEIVED NO GROWTH TO DATE CULTURE WILL BE HELD FOR 5 DAYS BEFORE ISSUING A FINAL NEGATIVE REPORT   Report Status PENDING   Incomplete  WOUND CULTURE     Status: None   Collection Time    01/06/13  5:04 PM      Result Value Range  Status   Specimen Description OTHER BUTTOCK   Final   Special Requests Immunocompromised   Final   Gram Stain     Final   Value: MODERATE WBC PRESENT, PREDOMINANTLY PMN     RARE SQUAMOUS EPITHELIAL CELLS PRESENT     NO ORGANISMS SEEN   Culture NO GROWTH 1 DAY   Final   Report Status PENDING   Incomplete    Anti-infectives   Start     Dose/Rate Route Frequency Ordered Stop   01/06/13 1700  atazanavir (REYATAZ) capsule 300 mg     300 mg Oral Daily with supper 01/06/13 1054     01/06/13 1700  ritonavir (NORVIR) tablet 100 mg     100 mg Oral Daily with supper 01/06/13 1054     01/06/13 1506  emtricitabine-tenofovir (TRUVADA) 200-300 MG per tablet 1 tablet     1 tablet Oral Daily with supper 01/06/13 1507     01/06/13 1448  valACYclovir (VALTREX) tablet 1,000 mg     1,000 mg Oral Daily with supper 01/06/13 1445     01/06/13 1200  emtricitabine-tenofovir (TRUVADA)  200-300 MG per tablet 1 tablet  Status:  Discontinued     1 tablet Oral Daily 01/06/13 1054 01/06/13 1507   01/06/13 1200  valACYclovir (VALTREX) tablet 1,000 mg  Status:  Discontinued     1,000 mg Oral Daily 01/06/13 1054 01/06/13 1445   01/06/13 1200  Ampicillin-Sulbactam (UNASYN) 3 g in sodium chloride 0.9 % 100 mL IVPB     3 g 100 mL/hr over 60 Minutes Intravenous Every 6 hours 01/06/13 1133     01/06/13 0400  piperacillin-tazobactam (ZOSYN) IVPB 3.375 g  Status:  Discontinued     3.375 g 12.5 mL/hr over 240 Minutes Intravenous 3 times per day 01/05/13 1933 01/06/13 1129   01/05/13 2130  atazanavir (REYATAZ) capsule 300 mg  Status:  Discontinued     300 mg Oral Daily with breakfast 01/05/13 2059 01/06/13 1054   01/05/13 2130  emtricitabine-tenofovir (TRUVADA) 200-300 MG per tablet 1 tablet  Status:  Discontinued     1 tablet Oral Daily 01/05/13 2059 01/06/13 1054   01/05/13 2130  ritonavir (NORVIR) capsule 100 mg  Status:  Discontinued     100 mg Oral Daily with breakfast 01/05/13 2059 01/06/13 1054   01/05/13 2130   valACYclovir (VALTREX) tablet 1,000 mg  Status:  Discontinued     1,000 mg Oral Daily 01/05/13 2059 01/06/13 1054   01/05/13 2000  vancomycin (VANCOCIN) IVPB 1000 mg/200 mL premix     1,000 mg 200 mL/hr over 60 Minutes Intravenous Every 8 hours 01/05/13 1927     01/05/13 1945  piperacillin-tazobactam (ZOSYN) IVPB 3.375 g     3.375 g 100 mL/hr over 30 Minutes Intravenous  Once 01/05/13 1927 01/05/13 2041   01/05/13 1815  clindamycin (CLEOCIN) IVPB 600 mg     600 mg 100 mL/hr over 30 Minutes Intravenous  Once 01/05/13 1814        Assessment: 22 yo with history of HIV admitted with peri-rectal abscess.  Day #3 Vancomycin 1g IV q8h, D#2 Unasyn 3g IV q6h per ID  Blood and wound cultures negative to date  SCr improving daily, CrCl >100 ml/min  Trough was drawn ~7 hours after previous dose, therefore, true level is slightly lower. Will consider this as within goal.  Goal of Therapy:  Vancomycin trough level 15-20 mcg/ml  Plan:   Continue Vancomycin 1g IV q8h  Continue to follow renal function and adjust as needed   Loralee Pacas, PharmD, BCPS Pager: [phone] 01/07/2013,11:33 AM

## 2013-01-07 NOTE — Progress Notes (Signed)
Subjective: Needs help from BR to bed.  Still complaining of soreness, he noted symptoms about 2 days prior to onset of spontaneous drainage from rectum on 7/5.  Objective: Vital signs in last 24 hours: Temp:  [97.1 F (36.2 C)-98.3 F (36.8 C)] 98.3 F (36.8 C) (07/07 0601) Pulse Rate:  [43-62] 43 (07/07 0601) Resp:  [17-18] 18 (07/07 0601) BP: (108-122)/(58-71) 122/67 mmHg (07/07 0601) SpO2:  [98 %] 98 % (07/07 0601) Last BM Date: 01/04/13 Afebrile, VSS Labs OK (WBC down 5.9) NPO Intake/Output from previous day: 07/06 0701 - 07/07 0700 In: 3116.3 [P.O.:540; I.V.:1576.3; IV Piggyback:1000] Out: 701 [Urine:700; Emesis/NG output:1] Intake/Output this shift:    General appearance: alert, cooperative, mild distress and very uncomfortable. Skin: Left buttocks, 2 sites open the first drained spontaneously on 7/5, the second from I/d that evening 7/5.  Indurated, but nothing to drain, no erythema around the sites  Lab Results:   Recent Labs  01/06/13 0506 01/07/13 0440  WBC 12.6* 5.9  HGB 13.2 13.1  HCT 39.0 38.2*  PLT 229 208    BMET  Recent Labs  01/06/13 0506 01/07/13 0440  NA 131* 136  K 3.3* 4.1  CL 97 103  CO2 26 30  GLUCOSE 106* 87  BUN 13 8  CREATININE 0.97 0.91  CALCIUM 8.7 8.4   PT/INR No results found for this basename: LABPROT, INR,  in the last 72 hours   Recent Labs Lab 01/06/13 0506 01/07/13 0440  AST 13 12  ALT 7 8  ALKPHOS 63 45  BILITOT 2.1* 1.8*  PROT 8.4* 7.9  ALBUMIN 2.9* 2.7*     Lipase     Component Value Date/Time   LIPASE 16 07/21/2010 0210     Studies/Results: Ct Pelvis W Contrast  01/05/2013   *RADIOLOGY REPORT*  Clinical Data:  Left block pain/draining abscess.  History of HIV.  CT PELVIS WITH CONTRAST  Technique:  Multidetector CT imaging of the pelvis was performed using the standard protocol following the bolus administration of intravenous contrast.  Contrast: OMNIPAQUE IOHEXOL 300 MG/ML  SOLN  Comparison:   Previous CT abdomen/pelvis 08/27/2004  Findings:  Peripherally enhancing multiloculated 3.6 x 3.5 x 2.6 cm fluid collection within the superficial subcutaneous fat of the medial left buttock consistent with abscess.  There is extensive inflammatory stranding in the adjacent adipose tissue. The adjacent gluteal muscles are spared.  The stranding ascends and abuts the anal verge.  Additionally, there is some circumferential thickening the distal rectum and anus.  Minimal inflammatory stranding noted in the left ischiorectal fossa.  No definite extension of the abscess into the ischiorectal fossa.  Visualized abdominal organs are unremarkable.  Normal appendix in the right lower quadrant. No acute fracture or aggressive appearing lytic or blastic osseous lesion.  IMPRESSION:  1.  Peripherally enhancing multiloculated 3.6 x 3.5 x 2.6 cm abscess in the superficial subcutaneous fat of the medial left buttock with extensive surrounding edema and inflammatory stranding.  No involvement of the left gluteal muscles. This secondary inflammatory stranding ascends to and abuts the anal verge.  Minimal stranding extends into the left ischiorectal fossa.  2.  Circumferential thickening of the distal rectum/anal verge may be related to the underlying infectious process or represent primary anoproctitis, hemorrhoids, or verrucae given the clinical history.   Original Report Authenticated By: Malachy Moan, M.D.    Medications: . ampicillin-sulbactam (UNASYN) IV  3 g Intravenous Q6H  . atazanavir  300 mg Oral Q supper  . clindamycin (  CLEOCIN) IV  600 mg Intravenous Once  . emtricitabine-tenofovir  1 tablet Oral Q supper  . methocarbamol  1,000 mg Oral QID  . naproxen  500 mg Oral BID WC  . polyethylene glycol  17 g Oral BID  . ritonavir  100 mg Oral Q supper  . triamcinolone ointment   Topical BID  . valACYclovir  1,000 mg Oral Q supper  . vancomycin  1,000 mg Intravenous Q8H    Assessment/Plan Left Perirectal  abscess 3.5 x 3.6 x 2.5 cm  (Vancomycin/Unasyn- GC and Chlamydia pending) STD HIV (no treatment for 2 years prior to admission) Bipolar/schizophrenia  Plan:  Continue antibiotics, and Sitz baths, local care.  We will let him eat and reexamine tomorrow. I will make him NPO for tomorrow AM, till we reexamine him.   LOS: 2 days    [name] 01/07/2013
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
## 2013-01-08 DIAGNOSIS — M549 Dorsalgia, unspecified: Secondary | ICD-10-CM

## 2013-01-08 LAB — BASIC METABOLIC PANEL
GFR calc Af Amer: >90 mL/min (ref >90)
GFR calc non Af Amer: >90 mL/min (ref >90)
Potassium: 3.9 mEq/L (ref 3.5–5.1)
Sodium: 135 mEq/L (ref 135–145)

## 2013-01-08 LAB — CBC
Hemoglobin: 13.2 g/dL (ref 13.0–17.0)
MCHC: 34 g/dL (ref 30.0–36.0)
RDW: 13 % (ref 11.5–15.5)
WBC: 5.1 10*3/uL (ref 4.0–10.5)

## 2013-01-08 NOTE — Progress Notes (Signed)
Patient interviewed and examined, agree with PA note above.  Rhyli Depaula T Symphanie Cederberg MD, FACS  01/08/2013 11:11 AM  
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
## 2013-01-08 NOTE — Progress Notes (Signed)
01/08/13 2115 sitz bath given tp patient.

## 2013-01-09 DIAGNOSIS — B20 Human immunodeficiency virus [HIV] disease: Secondary | ICD-10-CM

## 2013-01-09 DIAGNOSIS — L0390 Cellulitis, unspecified: Secondary | ICD-10-CM

## 2013-01-09 LAB — CBC
Hemoglobin: 13.7 g/dL (ref 13.0–17.0)
RBC: 4.68 MIL/uL (ref 4.22–5.81)

## 2013-01-09 LAB — BASIC METABOLIC PANEL
CO2: 29 mEq/L (ref 19–32)
Chloride: 101 mEq/L (ref 96–112)
Glucose, Bld: 103 mg/dL — ABNORMAL HIGH (ref 70–99)
Potassium: 3.6 mEq/L (ref 3.5–5.1)
Sodium: 136 mEq/L (ref 135–145)

## 2013-01-09 MED ORDER — AMOXICILLIN-POT CLAVULANATE 875-125 MG PO TABS
1.0000 | ORAL_TABLET | Freq: Two times a day (BID) | ORAL | Status: DC
  Administered 2013-01-09 – 2013-01-10 (×3): 1 via ORAL
  Filled 2013-01-09 (×4): qty 1
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
## 2013-01-10 LAB — WOUND CULTURE

## 2013-01-10 MED ORDER — SULFAMETHOXAZOLE-TMP DS 800-160 MG PO TABS
1.0000 | ORAL_TABLET | Freq: Two times a day (BID) | ORAL | Status: DC
  Administered 2013-01-10: 1 via ORAL
  Filled 2013-01-10 (×2): qty 1

## 2013-01-10 MED ORDER — SULFAMETHOXAZOLE-TMP DS 800-160 MG PO TABS: 2.0000 | ORAL_TABLET | Freq: Two times a day (BID) | ORAL | Status: DC

## 2013-01-10 NOTE — Progress Notes (Signed)
Pt stable, scripts, d/c instructions given with no questions/concerns voiced by pt.  Pt refused transport to front entrance and walked himself out with a friend.
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
## 2013-01-12 LAB — CULTURE, BLOOD (ROUTINE X 2)
Culture: NO GROWTH
Culture: NO GROWTH

## 2013-01-31 ENCOUNTER — Ambulatory Visit: Payer: Self-pay | Admitting: Internal Medicine

## 2013-02-11 ENCOUNTER — Ambulatory Visit: Payer: Self-pay | Admitting: Internal Medicine

## 2013-02-11 ENCOUNTER — Telehealth: Payer: Self-pay | Admitting: *Deleted

## 2013-02-11 NOTE — Telephone Encounter (Signed)
Patient called and advised that he is not going to make it to his appt today. He advised he has other appt and he was rescheduled to 03/12/13

## 2013-02-14 ENCOUNTER — Ambulatory Visit: Payer: Self-pay

## 2013-02-14 ENCOUNTER — Other Ambulatory Visit: Payer: Self-pay

## 2013-02-19 ENCOUNTER — Emergency Department (HOSPITAL_COMMUNITY): Payer: No Typology Code available for payment source

## 2013-02-19 ENCOUNTER — Encounter (HOSPITAL_COMMUNITY): Payer: Self-pay | Admitting: *Deleted

## 2013-02-19 ENCOUNTER — Emergency Department (HOSPITAL_COMMUNITY)
Admission: EM | Admit: 2013-02-19 | Discharge: 2013-02-19 | Disposition: A | Discharge status: Home / Self Care | Payer: No Typology Code available for payment source | Attending: Emergency Medicine | Admitting: Emergency Medicine

## 2013-02-19 DIAGNOSIS — Z91199 Patient's noncompliance with other medical treatment and regimen due to unspecified reason: Secondary | ICD-10-CM | POA: Insufficient documentation

## 2013-02-19 DIAGNOSIS — Z8639 Personal history of other endocrine, nutritional and metabolic disease: Secondary | ICD-10-CM | POA: Insufficient documentation

## 2013-02-19 DIAGNOSIS — Z9119 Patient's noncompliance with other medical treatment and regimen: Secondary | ICD-10-CM | POA: Insufficient documentation

## 2013-02-19 DIAGNOSIS — S0990XA Unspecified injury of head, initial encounter: Secondary | ICD-10-CM | POA: Insufficient documentation

## 2013-02-19 DIAGNOSIS — Y9389 Activity, other specified: Secondary | ICD-10-CM | POA: Insufficient documentation

## 2013-02-19 DIAGNOSIS — Y92410 Unspecified street and highway as the place of occurrence of the external cause: Secondary | ICD-10-CM | POA: Insufficient documentation

## 2013-02-19 DIAGNOSIS — F17200 Nicotine dependence, unspecified, uncomplicated: Secondary | ICD-10-CM | POA: Insufficient documentation

## 2013-02-19 DIAGNOSIS — Z8659 Personal history of other mental and behavioral disorders: Secondary | ICD-10-CM | POA: Insufficient documentation

## 2013-02-19 DIAGNOSIS — S0993XA Unspecified injury of face, initial encounter: Secondary | ICD-10-CM | POA: Insufficient documentation

## 2013-02-19 DIAGNOSIS — Z8619 Personal history of other infectious and parasitic diseases: Secondary | ICD-10-CM | POA: Insufficient documentation

## 2013-02-19 DIAGNOSIS — Z21 Asymptomatic human immunodeficiency virus [HIV] infection status: Secondary | ICD-10-CM | POA: Insufficient documentation

## 2013-02-19 DIAGNOSIS — Z79899 Other long term (current) drug therapy: Secondary | ICD-10-CM | POA: Insufficient documentation

## 2013-02-19 DIAGNOSIS — Z862 Personal history of diseases of the blood and blood-forming organs and certain disorders involving the immune mechanism: Secondary | ICD-10-CM | POA: Insufficient documentation

## 2013-02-19 DIAGNOSIS — Z859 Personal history of malignant neoplasm, unspecified: Secondary | ICD-10-CM | POA: Insufficient documentation

## 2013-02-19 MED ORDER — OXYCODONE-ACETAMINOPHEN 5-325 MG PO TABS
1.0000 | ORAL_TABLET | Freq: Once | ORAL | Status: AC
  Administered 2013-02-19: 1 via ORAL
  Filled 2013-02-19: qty 1
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
## 2013-03-12 ENCOUNTER — Ambulatory Visit: Payer: Self-pay | Admitting: Infectious Disease

## 2013-03-21 ENCOUNTER — Telehealth: Payer: Self-pay | Admitting: *Deleted

## 2013-03-21 NOTE — Telephone Encounter (Signed)
MD please review the current Septra DS instructions and advise about refill.

## 2013-03-26 NOTE — Telephone Encounter (Signed)
Why does he need septra ds?
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
## 2013-03-27 NOTE — Telephone Encounter (Signed)
COrrect

## 2013-05-09 ENCOUNTER — Other Ambulatory Visit: Payer: Self-pay

## 2013-07-15 ENCOUNTER — Ambulatory Visit: Payer: Self-pay

## 2013-07-16 ENCOUNTER — Other Ambulatory Visit: Payer: Self-pay

## 2013-07-16 ENCOUNTER — Encounter: Payer: Self-pay | Admitting: Infectious Disease

## 2013-07-16 ENCOUNTER — Ambulatory Visit (INDEPENDENT_AMBULATORY_CARE_PROVIDER_SITE_OTHER): Payer: Self-pay | Admitting: Infectious Disease

## 2013-07-16 ENCOUNTER — Ambulatory Visit: Payer: Self-pay

## 2013-07-16 VITALS — BP 107/72 | HR 106 | Temp 97.8°F | Ht 76.0 in | Wt 201.0 lb

## 2013-07-16 DIAGNOSIS — R85619 Unspecified abnormal cytological findings in specimens from anus: Secondary | ICD-10-CM

## 2013-07-16 DIAGNOSIS — R85618 Other abnormal cytological findings on specimens from anus: Secondary | ICD-10-CM

## 2013-07-16 DIAGNOSIS — B20 Human immunodeficiency virus [HIV] disease: Secondary | ICD-10-CM

## 2013-07-16 DIAGNOSIS — F3162 Bipolar disorder, current episode mixed, moderate: Secondary | ICD-10-CM

## 2013-07-16 DIAGNOSIS — R300 Dysuria: Secondary | ICD-10-CM

## 2013-07-16 DIAGNOSIS — F17200 Nicotine dependence, unspecified, uncomplicated: Secondary | ICD-10-CM

## 2013-07-16 DIAGNOSIS — L0292 Furuncle, unspecified: Secondary | ICD-10-CM

## 2013-07-16 DIAGNOSIS — Z113 Encounter for screening for infections with a predominantly sexual mode of transmission: Secondary | ICD-10-CM

## 2013-07-16 DIAGNOSIS — L0293 Carbuncle, unspecified: Secondary | ICD-10-CM

## 2013-07-16 DIAGNOSIS — Z23 Encounter for immunization: Secondary | ICD-10-CM

## 2013-07-16 DIAGNOSIS — F259 Schizoaffective disorder, unspecified: Secondary | ICD-10-CM

## 2013-07-16 DIAGNOSIS — F4481 Dissociative identity disorder: Secondary | ICD-10-CM

## 2013-07-16 LAB — CBC WITH DIFFERENTIAL/PLATELET
BASOS ABS: 0 10*3/uL (ref 0.0–0.1)
BASOS PCT: 0 % (ref 0–1)
EOS ABS: 0.1 10*3/uL (ref 0.0–0.7)
Eosinophils Relative: 1 % (ref 0–5)
HCT: 48.2 % (ref 39.0–52.0)
Hemoglobin: 16.7 g/dL (ref 13.0–17.0)
Lymphocytes Relative: 15 % (ref 12–46)
Lymphs Abs: 1.1 10*3/uL (ref 0.7–4.0)
MCH: 31.3 pg (ref 26.0–34.0)
MCHC: 34.6 g/dL (ref 30.0–36.0)
MCV: 90.4 fL (ref 78.0–100.0)
MONOS PCT: 11 % (ref 3–12)
Monocytes Absolute: 0.7 10*3/uL (ref 0.1–1.0)
NEUTROS PCT: 73 % (ref 43–77)
Neutro Abs: 5.2 10*3/uL (ref 1.7–7.7)
PLATELETS: 258 10*3/uL (ref 150–400)
RBC: 5.33 MIL/uL (ref 4.22–5.81)
RDW: 15 % (ref 11.5–15.5)
WBC: 7.1 10*3/uL (ref 4.0–10.5)

## 2013-07-16 LAB — COMPLETE METABOLIC PANEL WITH GFR
ALT: 26 U/L (ref 0–53)
AST: 32 U/L (ref 0–37)
Albumin: 4.3 g/dL (ref 3.5–5.2)
Alkaline Phosphatase: 61 U/L (ref 39–117)
BUN: 11 mg/dL (ref 6–23)
CALCIUM: 9.9 mg/dL (ref 8.4–10.5)
CHLORIDE: 100 meq/L (ref 96–112)
CO2: 28 meq/L (ref 19–32)
Creat: 0.94 mg/dL (ref 0.50–1.35)
GFR, Est Non African American: >89 mL/min
Glucose, Bld: 78 mg/dL (ref 70–99)
Potassium: 4.7 mEq/L (ref 3.5–5.3)
SODIUM: 137 meq/L (ref 135–145)
TOTAL PROTEIN: 9.2 g/dL — AB (ref 6.0–8.3)
Total Bilirubin: 0.5 mg/dL (ref 0.3–1.2)

## 2013-07-16 MED ORDER — RITONAVIR 100 MG PO CAPS: 100.0000 mg | ORAL_CAPSULE | Freq: Every day | ORAL | Status: DC

## 2013-07-16 MED ORDER — EMTRICITABINE-TENOFOVIR DF 200-300 MG PO TABS: 1.0000 | ORAL_TABLET | Freq: Every day | ORAL | Status: DC

## 2013-07-16 MED ORDER — ATAZANAVIR SULFATE 300 MG PO CAPS: 300.0000 mg | ORAL_CAPSULE | Freq: Every day | ORAL | Status: DC
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
## 2013-07-17 ENCOUNTER — Telehealth: Payer: Self-pay | Admitting: *Deleted

## 2013-07-17 LAB — T-HELPER CELL (CD4) - (RCID CLINIC ONLY)
CD4 % Helper T Cell: 31 % — ABNORMAL LOW (ref 33–55)
CD4 T Cell Abs: 340 /uL — ABNORMAL LOW (ref 400–2700)

## 2013-07-17 LAB — URINALYSIS
Bilirubin Urine: NEGATIVE
GLUCOSE, UA: NEGATIVE mg/dL
HGB URINE DIPSTICK: NEGATIVE
Ketones, ur: NEGATIVE mg/dL
Nitrite: NEGATIVE
PH: 7.5 (ref 5.0–8.0)
Protein, ur: NEGATIVE mg/dL
Specific Gravity, Urine: 1.025 (ref 1.005–1.030)
Urobilinogen, UA: 0.2 mg/dL (ref 0.0–1.0)

## 2013-07-17 LAB — HEPATITIS PANEL, ACUTE
HCV Ab: NEGATIVE
HEP A IGM: NONREACTIVE
HEP B C IGM: NONREACTIVE
Hepatitis B Surface Ag: NEGATIVE

## 2013-07-17 LAB — RPR: RPR Ser Ql: REACTIVE — AB

## 2013-07-17 LAB — URINE CULTURE
Colony Count: NO GROWTH
Organism ID, Bacteria: NO GROWTH

## 2013-07-17 LAB — T.PALLIDUM AB, TOTAL: T pallidum Antibodies (TP-PA): >8 S/CO — ABNORMAL HIGH (ref <0.90)

## 2013-07-17 LAB — HIV-1 RNA ULTRAQUANT REFLEX TO GENTYP+
HIV 1 RNA Quant: 34209 copies/mL — ABNORMAL HIGH (ref <20)
HIV-1 RNA QUANT, LOG: 4.53 {Log} — AB (ref <1.30)

## 2013-07-17 LAB — RPR TITER

## 2013-07-17 NOTE — Telephone Encounter (Signed)
Spoke with patient, relayed positive RPR.  Pt will come in the  Next 3 Thursdays for bicillin injections.  Informed him that the GC/NG was not back yet, may need additional treatment.

## 2013-07-17 NOTE — Telephone Encounter (Signed)
He needs  IM penicillin 2 million UNITS once weekly x 3 weeks. This MIGHT take care of anything in his urine depending on sensis of organism. Would NOT take care of GC or chlamydia

## 2013-07-17 NOTE — Telephone Encounter (Signed)
Patient is calling for his lab results from 07/16/13.  Please advise. Landis Gandy, RN

## 2013-07-17 NOTE — Telephone Encounter (Signed)
Well he has syphilis so THAT needs to be treated. I dont see the GC or chlamydia or urine culture back yet

## 2013-07-18 ENCOUNTER — Ambulatory Visit: Payer: Self-pay

## 2013-07-19 ENCOUNTER — Other Ambulatory Visit: Payer: Self-pay | Admitting: Licensed Clinical Social Worker

## 2013-07-19 DIAGNOSIS — B20 Human immunodeficiency virus [HIV] disease: Secondary | ICD-10-CM

## 2013-07-19 MED ORDER — ATAZANAVIR SULFATE 300 MG PO CAPS: 300.0000 mg | ORAL_CAPSULE | Freq: Every day | ORAL | Status: DC

## 2013-07-19 MED ORDER — EMTRICITABINE-TENOFOVIR DF 200-300 MG PO TABS: 1.0000 | ORAL_TABLET | Freq: Every day | ORAL | Status: DC

## 2013-07-19 MED ORDER — RITONAVIR 100 MG PO CAPS: 100.0000 mg | ORAL_CAPSULE | Freq: Every day | ORAL | Status: DC

## 2013-07-23 LAB — HLA B*5701: HLA-B 5701 W/RFLX HLA-B HIGH: NEGATIVE

## 2013-07-24 ENCOUNTER — Telehealth: Payer: Self-pay | Admitting: Infectious Disease

## 2013-07-24 ENCOUNTER — Telehealth: Payer: Self-pay | Admitting: *Deleted

## 2013-07-24 ENCOUNTER — Ambulatory Visit: Payer: Self-pay

## 2013-07-24 NOTE — Telephone Encounter (Signed)
Patient called advised was disconnected earlier from nurse telling him he has +RPR. Advised him she was trying to set up an appt for him to receive treatment and he advised he will come in today to have his 1st dose.

## 2013-07-24 NOTE — Telephone Encounter (Signed)
[name] needs 3 shots of IM pencillin 2.4 million units at each visit.   He also needs his ADAP done

## 2013-07-24 NOTE — Telephone Encounter (Signed)
I called the patient but the phone disconnected as I was explaining to him about the injections. I called back and got his voicemail, so I asked him to call the office asap to schedule an appointment.

## 2013-07-25 ENCOUNTER — Ambulatory Visit: Payer: Self-pay

## 2013-07-26 LAB — HIV-1 GENOTYPR PLUS

## 2013-08-01 ENCOUNTER — Ambulatory Visit: Payer: Self-pay

## 2013-08-06 ENCOUNTER — Other Ambulatory Visit: Payer: Self-pay | Admitting: Licensed Clinical Social Worker

## 2013-08-06 DIAGNOSIS — B20 Human immunodeficiency virus [HIV] disease: Secondary | ICD-10-CM

## 2013-08-06 MED ORDER — RITONAVIR 100 MG PO CAPS: 100.0000 mg | ORAL_CAPSULE | Freq: Every day | ORAL | Status: DC

## 2013-08-06 MED ORDER — EMTRICITABINE-TENOFOVIR DF 200-300 MG PO TABS: 1.0000 | ORAL_TABLET | Freq: Every day | ORAL | Status: DC

## 2013-08-06 MED ORDER — ATAZANAVIR SULFATE 300 MG PO CAPS: 300.0000 mg | ORAL_CAPSULE | Freq: Every day | ORAL | Status: DC

## 2013-08-08 ENCOUNTER — Ambulatory Visit: Payer: Self-pay

## 2013-08-13 ENCOUNTER — Ambulatory Visit: Payer: Self-pay

## 2013-08-19 ENCOUNTER — Ambulatory Visit: Payer: Self-pay | Admitting: Infectious Disease

## 2013-08-29 ENCOUNTER — Ambulatory Visit: Payer: Self-pay

## 2013-10-08 ENCOUNTER — Telehealth: Payer: Self-pay | Admitting: *Deleted

## 2013-10-08 ENCOUNTER — Encounter: Payer: Self-pay | Admitting: *Deleted

## 2013-10-08 NOTE — Telephone Encounter (Signed)
Patient returned call from clinic.  Pt states he wasn't sure what the call was regarding.  Pt received only 1 of 3 bicillin injections in January.  Pt has no showed his recent appointments.  Pt is working at the [employer] and won't be able to come in until after the 18th.

## 2013-10-08 NOTE — Telephone Encounter (Signed)
Attempted to call the pt back at phone number left on message.  Brief response, then no answer. Sent pt a MyChart message to reach out to the him.

## 2013-10-09 NOTE — Telephone Encounter (Signed)
Patient given appointment for 4/29 11:15 work in with Dr. Tommy Medal.  Per Dr Tommy Medal, patient needs to start his 3 injections of bicillin again.  Left patient message asking to call back to schedule these 3 injections. Landis Gandy, RN

## 2013-10-30 ENCOUNTER — Ambulatory Visit: Payer: Self-pay | Admitting: Infectious Disease

## 2013-11-12 ENCOUNTER — Telehealth: Payer: Self-pay | Admitting: *Deleted

## 2013-11-12 NOTE — Telephone Encounter (Signed)
Received message that the patient c/o boils.  Pt was speaking with front desk when his phone lost signal.  RN attempted to contact patient, but his phone does not accept incoming calls. Landis Gandy, RN

## 2013-11-20 ENCOUNTER — Telehealth: Payer: Self-pay | Admitting: Infectious Disease

## 2013-11-20 NOTE — Telephone Encounter (Signed)
Pt has no showed multiple appts, transferred form [facility]. Is he still our pt or has he gone back to [place] or [facility] or is he just [place]

## 2013-11-22 NOTE — Telephone Encounter (Signed)
Thanks Jackie 

## 2013-11-22 NOTE — Telephone Encounter (Signed)
Phone not in service referred to [facility]

## 2014-01-16 ENCOUNTER — Other Ambulatory Visit: Payer: Self-pay

## 2014-01-21 ENCOUNTER — Encounter: Payer: Self-pay | Admitting: Infectious Disease

## 2014-01-21 ENCOUNTER — Ambulatory Visit (INDEPENDENT_AMBULATORY_CARE_PROVIDER_SITE_OTHER): Payer: Self-pay | Admitting: Infectious Disease

## 2014-01-21 ENCOUNTER — Encounter: Payer: Self-pay | Admitting: Licensed Clinical Social Worker

## 2014-01-21 VITALS — BP 114/82 | HR 78 | Temp 98.1°F | Ht 75.0 in | Wt 192.0 lb

## 2014-01-21 DIAGNOSIS — Z113 Encounter for screening for infections with a predominantly sexual mode of transmission: Secondary | ICD-10-CM

## 2014-01-21 DIAGNOSIS — B20 Human immunodeficiency virus [HIV] disease: Secondary | ICD-10-CM

## 2014-01-21 DIAGNOSIS — R85619 Unspecified abnormal cytological findings in specimens from anus: Secondary | ICD-10-CM

## 2014-01-21 DIAGNOSIS — Z9119 Patient's noncompliance with other medical treatment and regimen: Secondary | ICD-10-CM

## 2014-01-21 DIAGNOSIS — A5400 Gonococcal infection of lower genitourinary tract, unspecified: Secondary | ICD-10-CM

## 2014-01-21 DIAGNOSIS — F3162 Bipolar disorder, current episode mixed, moderate: Secondary | ICD-10-CM

## 2014-01-21 DIAGNOSIS — A549 Gonococcal infection, unspecified: Secondary | ICD-10-CM

## 2014-01-21 DIAGNOSIS — R85618 Other abnormal cytological findings on specimens from anus: Secondary | ICD-10-CM

## 2014-01-21 DIAGNOSIS — A539 Syphilis, unspecified: Secondary | ICD-10-CM

## 2014-01-21 DIAGNOSIS — Z91199 Patient's noncompliance with other medical treatment and regimen due to unspecified reason: Secondary | ICD-10-CM

## 2014-01-21 DIAGNOSIS — A630 Anogenital (venereal) warts: Secondary | ICD-10-CM

## 2014-01-21 LAB — CBC WITH DIFFERENTIAL/PLATELET
Basophils Absolute: 0 10*3/uL (ref 0.0–0.1)
Basophils Relative: 0 % (ref 0–1)
EOS ABS: 0 10*3/uL (ref 0.0–0.7)
Eosinophils Relative: 1 % (ref 0–5)
HCT: 43.6 % (ref 39.0–52.0)
HEMOGLOBIN: 15.3 g/dL (ref 13.0–17.0)
LYMPHS ABS: 1.3 10*3/uL (ref 0.7–4.0)
LYMPHS PCT: 29 % (ref 12–46)
MCH: 30.4 pg (ref 26.0–34.0)
MCHC: 35.1 g/dL (ref 30.0–36.0)
MCV: 86.7 fL (ref 78.0–100.0)
MONOS PCT: 10 % (ref 3–12)
Monocytes Absolute: 0.5 10*3/uL (ref 0.1–1.0)
NEUTROS ABS: 2.8 10*3/uL (ref 1.7–7.7)
NEUTROS PCT: 60 % (ref 43–77)
PLATELETS: 317 10*3/uL (ref 150–400)
RBC: 5.03 MIL/uL (ref 4.22–5.81)
RDW: 14.1 % (ref 11.5–15.5)
WBC: 4.6 10*3/uL (ref 4.0–10.5)

## 2014-01-21 LAB — COMPLETE METABOLIC PANEL WITH GFR
ALK PHOS: 44 U/L (ref 39–117)
ALT: 12 U/L (ref 0–53)
AST: 19 U/L (ref 0–37)
Albumin: 3.5 g/dL (ref 3.5–5.2)
BILIRUBIN TOTAL: 0.3 mg/dL (ref 0.2–1.2)
BUN: 10 mg/dL (ref 6–23)
CHLORIDE: 101 meq/L (ref 96–112)
CO2: 29 mEq/L (ref 19–32)
CREATININE: 0.89 mg/dL (ref 0.50–1.35)
Calcium: 9.1 mg/dL (ref 8.4–10.5)
Glucose, Bld: 63 mg/dL — ABNORMAL LOW (ref 70–99)
POTASSIUM: 4.2 meq/L (ref 3.5–5.3)
Sodium: 136 mEq/L (ref 135–145)
Total Protein: 9 g/dL — ABNORMAL HIGH (ref 6.0–8.3)

## 2014-01-21 LAB — RPR: RPR: REACTIVE — AB

## 2014-01-21 LAB — RPR TITER: RPR Titer: 1:8 {titer}

## 2014-01-21 MED ORDER — ABACAVIR-DOLUTEGRAVIR-LAMIVUD 600-50-300 MG PO TABS: 1.0000 | ORAL_TABLET | Freq: Every day | ORAL | Status: DC

## 2014-01-21 MED ORDER — PENICILLIN G BENZATHINE 1200000 UNIT/2ML IM SUSP
1.2000 10*6.[IU] | Freq: Once | INTRAMUSCULAR | Status: DC
  Administered 2014-01-22: 1.2 10*6.[IU] via INTRAMUSCULAR

## 2014-01-21 MED ORDER — IMIQUIMOD 5 % EX CREA: TOPICAL_CREAM | CUTANEOUS | Status: DC

## 2014-01-21 NOTE — Progress Notes (Signed)
Subjective:    Patient ID: [name], male    DOB: 09-12-1990, 23 y.o.   MRN: [id number]  HPI   23 year old [place] American man with history of HIV, called initially at [facility] none at [facility] with a history of intermittent compliance with antiretroviral medications. He was previously on Viramune he claimed  along with Truvada and boosted Reyataz. 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 While he was at [place] he did achieve undetectable viral load on a regimen of Reyataz Norvir Truvada.   I have restarted his Reyataz Norvir Truvada one I saw him last summer and he was taking this medication regimen until approximately a month ago prior to seeing me IN January.  He failed to renew his AIDS drug assistance program application.   At that time he stated that hehe dislikes taking the Reyataz Norvir and Truvada do to it making him have difficulty sleeping as well as nausea and a have increased appetite and affecting his mood.  Since that visit he yet again failed to renew his ADAP application and has been off antiretrovirals since then. He was treated for syphilis by [place] in the winter but is apparently been diagnosed with either being contacted for syphilis patient or actually having himself sound like he was treated with penicillin for syphilis and ceftriaxone azithromycin gonorrhea at the [facility] department.  He is radiated back on antiretroviral medications and his case manager as put in paperwork to get his ADAP application.  I reviewed options for him including but him on EVOTAZ and Truvada versus starting him on [place] and we have opted for the latter provided he will be compliant with his antiretroviral meds     Review of Systems  Constitutional: Negative for fever, chills, diaphoresis, activity change, appetite change, fatigue and unexpected  weight change.  HENT: Negative for congestion, rhinorrhea, sinus pressure, sneezing, sore throat and trouble swallowing.   Eyes: Negative for photophobia and visual disturbance.  Respiratory: Negative for cough, chest tightness, shortness of breath, wheezing and stridor.   Cardiovascular: Negative for chest pain, palpitations and leg swelling.  Gastrointestinal: Positive for rectal pain. Negative for nausea, vomiting, abdominal pain, diarrhea, constipation, blood in stool, abdominal distention and anal bleeding.  Genitourinary: Negative for hematuria, flank pain and difficulty urinating.  Musculoskeletal: Negative for arthralgias, back pain, gait problem, joint swelling and myalgias.  Skin: Negative for color change, pallor, rash and wound.  Neurological: Negative for dizziness, tremors, weakness and light-headedness.  Hematological: Negative for adenopathy. Does not bruise/bleed easily.  Psychiatric/Behavioral: Negative for behavioral problems, confusion, sleep disturbance, decreased concentration and agitation.       Objective:   Physical Exam  Constitutional: He is oriented to person, place, and time. He appears well-developed and well-nourished. No distress.  HENT:  Head: Normocephalic and atraumatic.  Mouth/Throat: Oropharynx is clear and moist. No oropharyngeal exudate.  Eyes: Conjunctivae and EOM are normal. Pupils are equal, round, and reactive to light. No scleral icterus.  Neck: Normal range of motion. Neck supple. No JVD present.  Cardiovascular: Normal rate, regular rhythm and normal heart sounds.   Pulmonary/Chest: Effort normal and breath sounds normal. No respiratory distress. He has no wheezes.  Abdominal: He exhibits no distension. There is no tenderness.  Genitourinary: Testes normal. No penile erythema or penile tenderness. No discharge found.     Musculoskeletal: He exhibits no edema and no tenderness.  Lymphadenopathy:    He has no cervical adenopathy.        Right: Inguinal adenopathy present.       Left: Inguinal adenopathy present.  Neurological: He is alert and oriented to person, place, and time. He exhibits normal muscle tone. Coordination normal.  Skin: Skin is warm and dry. He is not diaphoretic. No erythema. No pallor.     Psychiatric: He has a normal mood and affect. His behavior is normal. Judgment and thought content normal.   Anogenital lesions          Assessment & Plan:  HIV: Start TRIUMEQ today check baseline viral load today and bring back in one month's time for recheck a viral load.  I spent greater than 40 minutes with the patient including greater than 50% of time in face to face counsel of the patient and in coordination of their care. .   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 Was in better spirits today   Smoking: encouraged him to stop tobacco, he was trying to use tobacco  Anal wart: try aldara, also hx of abnormal pap smear, refer to  Dr. Johnnye Sima fro [facility]  Syphilis, GC +- chlamydia: Get labs today and get labs in the health department and given a second dose of penicillin today. Condoms given

## 2014-01-22 LAB — T-HELPER CELL (CD4) - (RCID CLINIC ONLY)
CD4 T CELL ABS: 380 /uL — AB (ref 400–2700)
CD4 T CELL HELPER: 28 % — AB (ref 33–55)

## 2014-01-22 LAB — FLUORESCENT TREPONEMAL AB(FTA)-IGG-BLD: Fluorescent Treponemal ABS: REACTIVE — AB

## 2014-01-27 LAB — HIV-1 RNA QUANT-NO REFLEX-BLD
HIV 1 RNA QUANT: 32200 {copies}/mL — AB (ref <20)
HIV-1 RNA QUANT, LOG: 4.51 {Log} — AB (ref <1.30)

## 2014-02-24 ENCOUNTER — Encounter: Payer: Self-pay | Admitting: Infectious Disease

## 2014-02-24 ENCOUNTER — Ambulatory Visit (INDEPENDENT_AMBULATORY_CARE_PROVIDER_SITE_OTHER): Payer: Self-pay | Admitting: Infectious Disease

## 2014-02-24 VITALS — BP 124/79 | HR 66 | Temp 98.8°F | Ht 75.0 in | Wt 193.8 lb

## 2014-02-24 DIAGNOSIS — A630 Anogenital (venereal) warts: Secondary | ICD-10-CM

## 2014-02-24 DIAGNOSIS — B20 Human immunodeficiency virus [HIV] disease: Secondary | ICD-10-CM

## 2014-02-24 DIAGNOSIS — F209 Schizophrenia, unspecified: Secondary | ICD-10-CM

## 2014-02-24 DIAGNOSIS — Z23 Encounter for immunization: Secondary | ICD-10-CM

## 2014-02-24 DIAGNOSIS — F203 Undifferentiated schizophrenia: Secondary | ICD-10-CM

## 2014-02-24 DIAGNOSIS — Z113 Encounter for screening for infections with a predominantly sexual mode of transmission: Secondary | ICD-10-CM

## 2014-02-24 MED ORDER — ABACAVIR-DOLUTEGRAVIR-LAMIVUD 600-50-300 MG PO TABS: 1.0000 | ORAL_TABLET | Freq: Every day | ORAL | Status: DC

## 2014-02-24 NOTE — Progress Notes (Signed)
Subjective:    Patient ID: [name], male    DOB: 04-13-1991, 23 y.o.   MRN: [id number]  HPI   23 year old [place] American man with history of HIV, called initially at [facility] none at [facility] with a history of intermittent compliance with antiretroviral medications. He was previously on Viramune he claimed  along with Truvada and boosted Reyataz. 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 While he was at [facility] he did achieve undetectable viral load on a regimen of Reyataz Norvir Truvada.   I had restarted his Reyataz Norvir Truvada one I saw him last summer and he was taking this medication regimen until approximately a month ago prior to seeing me IN January.  He failed to renew his AIDS drug assistance program application.   At that time he stated that hehe dislikes taking the Reyataz Norvir and Truvada do to it making him have difficulty sleeping as well as nausea and a have increased appetite and affecting his mood.  Since that visit he had yet again failed to renew his ADAP application and has been off antiretrovirals since then.   He was treated for syphilis by [place] in the winter but is apparently been diagnosed with either being contacted for syphilis patient or actually having himself sound like he was treated with penicillin for syphilis and ceftriaxone azithromycin gonorrhea at the health department.  He DID finally get ADAP but one month ago when I thought I had started him on [place] he apparently never had meds sent to mail order pharmacy which he was expecting. He did not bother to contact us re this and he remains off of ARV.   We made sure he is indeed eligible and sent script to [facility] today and he arranged for pickup tomorrow am. I am trying to bring back for recheck labs on 03/26/14 and fu with me in October       Review  of Systems  Constitutional: Negative for fever, chills, diaphoresis, activity change, appetite change, fatigue and unexpected weight change.  HENT: Negative for congestion, rhinorrhea, sinus pressure, sneezing, sore throat and trouble swallowing.   Eyes: Negative for photophobia and visual disturbance.  Respiratory: Negative for cough, chest tightness, shortness of breath, wheezing and stridor.   Cardiovascular: Negative for chest pain, palpitations and leg swelling.  Gastrointestinal: Positive for rectal pain. Negative for nausea, vomiting, abdominal pain, diarrhea, constipation, blood in stool, abdominal distention and anal bleeding.  Genitourinary: Negative for hematuria, flank pain and difficulty urinating.  Musculoskeletal: Negative for arthralgias, back pain, gait problem, joint swelling and myalgias.  Skin: Negative for color change, pallor, rash and wound.  Neurological: Negative for dizziness, tremors, weakness and light-headedness.  Hematological: Negative for adenopathy. Does not bruise/bleed easily.  Psychiatric/Behavioral: Negative for behavioral problems, confusion, sleep disturbance, decreased concentration and agitation.       Objective:   Physical Exam  Constitutional: He is oriented to person, place, and time. He appears well-developed and well-nourished. No distress.  HENT:  Head: Normocephalic and atraumatic.  Mouth/Throat: Oropharynx is clear and moist. No oropharyngeal exudate.  Eyes: Conjunctivae and EOM are normal. Pupils are equal, round, and reactive to light. No scleral icterus.  Neck: Normal range of motion. Neck supple. No JVD present.  Cardiovascular: Normal rate, regular rhythm and normal heart sounds.   Pulmonary/Chest: Effort normal and breath sounds normal. No respiratory distress. He has  no wheezes.  Abdominal: He exhibits no distension. There is no tenderness.  Genitourinary: Testes normal. No penile erythema or penile tenderness. No discharge found.    Musculoskeletal: He exhibits no edema and no tenderness.  Lymphadenopathy:    He has no cervical adenopathy.       Right: Inguinal adenopathy present.       Left: Inguinal adenopathy present.  Neurological: He is alert and oriented to person, place, and time. He exhibits normal muscle tone. Coordination normal.  Skin: Skin is warm and dry. He is not diaphoretic. No erythema. No pallor.     Psychiatric: He has a normal mood and affect. His behavior is normal. Judgment and thought content normal.   Anogenital lesions          Assessment & Plan:  HIV: Start TRIUMEQ today check labs on [id number]  I spent greater than 25 minutes with the patient including greater than 50% of time in face to face counsel of the patient and in coordination of their care. .   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 Was in better spirits today   Smoking: encouraged him to stop tobacco, he was trying to use tobacco  Anal wart: try aldara, also hx of abnormal pap smear, refer to  Dr. Johnnye Sima fro [facility]  Syphilis, gc and chlamydia: recheck Gc and chlamydia today. Too soon to recheck RPR

## 2014-02-24 NOTE — Patient Instructions (Signed)
PLEASE START TRIUMEQ ASAP  COME BACK ON [facility] September 23RD FOR BLOOD WORK AND THEN THEN APPT WITH DR. VAN DAM IN OCTOBER

## 2014-02-25 LAB — T-HELPER CELL (CD4) - (RCID CLINIC ONLY)
CD4 % Helper T Cell: 26 % — ABNORMAL LOW (ref 33–55)
CD4 T CELL ABS: 320 /uL — AB (ref 400–2700)

## 2014-02-26 LAB — QUANTIFERON TB GOLD ASSAY (BLOOD)
INTERFERON GAMMA RELEASE ASSAY: NEGATIVE
Mitogen value: 1.11 IU/mL
QUANTIFERON NIL VALUE: 0.03 [IU]/mL
QUANTIFERON TB AG MINUS NIL: 0 [IU]/mL
TB Ag value: 0.03 IU/mL

## 2014-03-18 ENCOUNTER — Telehealth: Payer: Self-pay | Admitting: *Deleted

## 2014-03-18 NOTE — Telephone Encounter (Signed)
Headache, nausea, abd pain, chills/fever x 2 days.  Went to [facility] ED.  No definitive dx.  Did find blood in his urine.  Pt most concerned about the blood in his urine.  Pt wondering if there is connection between starting Triumeq and any of these symptoms.  Pt given appt for Thurs., 03/20/14 @ 0845.  Pt will call back if he is unable to keep this appt.  Trying to arrange transportation from [facility].

## 2014-03-19 NOTE — Telephone Encounter (Signed)
NONE of these symptoms are consistent with ANYTHING to do with [place]  He could have had a kidney stone or bladder infection again has nothing to do with this medicine

## 2014-03-20 ENCOUNTER — Ambulatory Visit: Payer: Self-pay | Admitting: Internal Medicine

## 2014-03-25 NOTE — Telephone Encounter (Signed)
Need to speak with pt about Dr Lucianne Lei Dam's comments.

## 2014-04-07 ENCOUNTER — Other Ambulatory Visit: Payer: Self-pay

## 2014-04-21 ENCOUNTER — Ambulatory Visit: Payer: Self-pay | Admitting: Infectious Disease

## 2014-05-22 ENCOUNTER — Other Ambulatory Visit: Payer: Self-pay

## 2014-05-26 ENCOUNTER — Other Ambulatory Visit: Payer: Self-pay

## 2014-06-05 ENCOUNTER — Encounter: Payer: Self-pay | Admitting: Infectious Disease

## 2014-06-05 ENCOUNTER — Ambulatory Visit (INDEPENDENT_AMBULATORY_CARE_PROVIDER_SITE_OTHER): Payer: Self-pay | Admitting: Infectious Disease

## 2014-06-05 VITALS — BP 105/70 | HR 78 | Temp 97.5°F | Wt 199.0 lb

## 2014-06-05 DIAGNOSIS — F203 Undifferentiated schizophrenia: Secondary | ICD-10-CM

## 2014-06-05 DIAGNOSIS — A5400 Gonococcal infection of lower genitourinary tract, unspecified: Secondary | ICD-10-CM

## 2014-06-05 DIAGNOSIS — A749 Chlamydial infection, unspecified: Secondary | ICD-10-CM

## 2014-06-05 DIAGNOSIS — B20 Human immunodeficiency virus [HIV] disease: Secondary | ICD-10-CM

## 2014-06-05 DIAGNOSIS — Z23 Encounter for immunization: Secondary | ICD-10-CM

## 2014-06-05 DIAGNOSIS — A539 Syphilis, unspecified: Secondary | ICD-10-CM

## 2014-06-05 MED ORDER — ONDANSETRON HCL 4 MG PO TABS: 4.0000 mg | ORAL_TABLET | Freq: Four times a day (QID) | ORAL | Status: AC | PRN

## 2014-06-05 MED ORDER — ABACAVIR-DOLUTEGRAVIR-LAMIVUD 600-50-300 MG PO TABS: 1.0000 | ORAL_TABLET | Freq: Every day | ORAL | Status: AC

## 2014-06-05 NOTE — Patient Instructions (Signed)
I would like you to try the [place] again  It can be taken WITH OR WITHOUT FOOD (it may be easier on you with food)  Please take zofran (odansetron) 4mg  tablet one hour before taking your TRIUMEQ to help take nausea edge off and you can take this med up to four times daily for nausea  Please re-sign up for Mychart so you can email me with problems  RTC in January for labs and a visit with me

## 2014-06-05 NOTE — Progress Notes (Signed)
Subjective:    Patient ID: [name], male    DOB: 1990-12-19, 23 y.o.   MRN: [id number]  HPI   23 year old [place] American man with history of HIV, called initially at [facility] none at [facility] with a history of intermittent compliance with antiretroviral medications. He was previously on Viramune he claimed  along with Truvada and boosted Reyataz. 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 While he was at [facility] he did achieve undetectable viral load on a regimen of Reyataz Norvir Truvada.   I had restarted his Reyataz Norvir Truvada o  He has been only intermittently in care. He had told me that he  dislikes taking the Reyataz Norvir and Truvada do to it making him have difficulty sleeping as well as nausea and a have increased appetite and affecting his mood.  He DID finally get ADAP but this fall when we started him on TRIUMEQ. He took the [facility] for a few months but suffered from nausea vomiting and had hematuria. He also had some abdominal pain and attribute all the symptoms to [facility]. We were not helped by the fact that the doctors at [facility] seem to suggest to him that the [facility] could be responsible for his abdominal pain symptoms something I find highly unlikely.  We had an extensive discussion today about why the patient will not take antiretrovirals reliably. He states that he doesn't like taking pills. When I asked him why he states that he had overdosed once before on medications.. I asked him about whether he took his medicines for bipolar disorder and he said he doesn't take those either.  Again after exhaustive discussion we ultimately started decided to start [place] again and see how he would do on this with an antinausea medicine Zofran on board. He will need to, and renew his a depth application in January and will see me  and then as well.  We made sure he is indeed eligible and sent script to [facility] today and he arranged for pickup tomorrow am. I am trying to bring back for recheck labs on 03/26/14 and fu with me in October       Review of Systems  Constitutional: Negative for fever, chills, diaphoresis, activity change, appetite change, fatigue and unexpected weight change.  HENT: Negative for congestion, rhinorrhea, sinus pressure, sneezing, sore throat and trouble swallowing.   Eyes: Negative for photophobia and visual disturbance.  Respiratory: Negative for cough, chest tightness, shortness of breath, wheezing and stridor.   Cardiovascular: Negative for chest pain, palpitations and leg swelling.  Gastrointestinal: Positive for rectal pain. Negative for nausea, vomiting, abdominal pain, diarrhea, constipation, blood in stool, abdominal distention and anal bleeding.  Genitourinary: Negative for hematuria, flank pain and difficulty urinating.  Musculoskeletal: Negative for myalgias, back pain, joint swelling, arthralgias and gait problem.  Skin: Negative for color change, pallor, rash and wound.  Neurological: Negative for dizziness, tremors, weakness and light-headedness.  Hematological: Negative for adenopathy. Does not bruise/bleed easily.  Psychiatric/Behavioral: Positive for behavioral problems, dysphoric mood and decreased concentration. Negative for confusion, sleep disturbance and agitation.       Objective:   Physical Exam  Constitutional: He is oriented to person, place, and time. He appears well-developed and well-nourished. No distress.  HENT:  Head: Normocephalic and atraumatic.  Mouth/Throat: Oropharynx is clear and moist. No oropharyngeal exudate.  Eyes: Conjunctivae and EOM  are normal. Pupils are equal, round, and reactive to light. No scleral icterus.  Neck: Normal range of motion. Neck supple.  Cardiovascular: Normal rate and regular rhythm.   Pulmonary/Chest: Effort  normal. No respiratory distress. He has no wheezes.  Abdominal: Soft. He exhibits no distension.  Musculoskeletal: He exhibits no edema or tenderness.  Neurological: He is alert and oriented to person, place, and time. He exhibits normal muscle tone. Coordination normal.  Skin: Skin is warm and dry. He is not diaphoretic. No erythema. No pallor.  Psychiatric: His behavior is normal. Judgment and thought content normal. He exhibits a depressed mood.       Assessment & Plan:  HIV: rerstart TRIUMEQ today check labs and bring back in January.  I spent greater than 40 minutes with the patient including greater than 50% of time in face to face counsel of the patient and in coordination of their care. .  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 Johnnye Sima fro HRA but first lets make sure he can actually come to clinic reliably  Syphilis, gc and chlamydia: recheck Gc and chlamydia today and RPR

## 2014-07-07 ENCOUNTER — Other Ambulatory Visit: Payer: Self-pay

## 2014-07-07 ENCOUNTER — Telehealth: Payer: Self-pay | Admitting: *Deleted

## 2014-07-07 NOTE — Telephone Encounter (Signed)
Called patient to offer an appointment for HRA with Dr. Johnnye Sima on 07/18/14. He refused stating he has been seen at [facility] in the past for this and if it needs to be repeated he wants it done there. Advised patient to discuss this with Dr. Tommy Medal at his next appointment on 07/21/14.

## 2014-07-15 ENCOUNTER — Encounter: Payer: Self-pay | Admitting: *Deleted

## 2014-07-15 NOTE — Progress Notes (Signed)
Patient ID: [name], male   DOB: 05-Mar-1991, 24 y.o.   MRN: [id number] Ct opened to [facility] w/ Giara Mcgaughey @ [facility] 01/02/14.    CM working to get ct in care for mental health services and medication management w/ [facility] ASAP.

## 2014-07-21 ENCOUNTER — Encounter: Payer: Self-pay | Admitting: Infectious Disease

## 2014-07-21 ENCOUNTER — Ambulatory Visit (INDEPENDENT_AMBULATORY_CARE_PROVIDER_SITE_OTHER): Payer: Self-pay | Admitting: Infectious Disease

## 2014-07-21 VITALS — BP 130/82 | HR 51 | Temp 97.9°F | Ht 75.0 in | Wt 196.0 lb

## 2014-07-21 DIAGNOSIS — F1099 Alcohol use, unspecified with unspecified alcohol-induced disorder: Secondary | ICD-10-CM

## 2014-07-21 DIAGNOSIS — B20 Human immunodeficiency virus [HIV] disease: Secondary | ICD-10-CM

## 2014-07-21 DIAGNOSIS — Z7289 Other problems related to lifestyle: Secondary | ICD-10-CM | POA: Insufficient documentation

## 2014-07-21 DIAGNOSIS — Z113 Encounter for screening for infections with a predominantly sexual mode of transmission: Secondary | ICD-10-CM

## 2014-07-21 DIAGNOSIS — Z789 Other specified health status: Secondary | ICD-10-CM | POA: Insufficient documentation

## 2014-07-21 DIAGNOSIS — Z23 Encounter for immunization: Secondary | ICD-10-CM

## 2014-07-21 DIAGNOSIS — A539 Syphilis, unspecified: Secondary | ICD-10-CM

## 2014-07-21 DIAGNOSIS — A630 Anogenital (venereal) warts: Secondary | ICD-10-CM

## 2014-07-21 DIAGNOSIS — F1090 Alcohol use, unspecified, uncomplicated: Secondary | ICD-10-CM

## 2014-07-21 LAB — CBC WITH DIFFERENTIAL/PLATELET
BASOS ABS: 0 10*3/uL (ref 0.0–0.1)
Basophils Relative: 0 % (ref 0–1)
EOS ABS: 0.1 10*3/uL (ref 0.0–0.7)
EOS PCT: 1 % (ref 0–5)
HEMATOCRIT: 48 % (ref 39.0–52.0)
Hemoglobin: 15.8 g/dL (ref 13.0–17.0)
LYMPHS PCT: 23 % (ref 12–46)
Lymphs Abs: 1.3 10*3/uL (ref 0.7–4.0)
MCH: 28.8 pg (ref 26.0–34.0)
MCHC: 32.9 g/dL (ref 30.0–36.0)
MCV: 87.4 fL (ref 78.0–100.0)
MONOS PCT: 11 % (ref 3–12)
MPV: 10.4 fL (ref 8.6–12.4)
Monocytes Absolute: 0.6 10*3/uL (ref 0.1–1.0)
NEUTROS ABS: 3.6 10*3/uL (ref 1.7–7.7)
Neutrophils Relative %: 65 % (ref 43–77)
Platelets: 254 10*3/uL (ref 150–400)
RBC: 5.49 MIL/uL (ref 4.22–5.81)
RDW: 15.2 % (ref 11.5–15.5)
WBC: 5.6 10*3/uL (ref 4.0–10.5)

## 2014-07-21 NOTE — Progress Notes (Signed)
Subjective:    Patient ID: [name], male    DOB: 01-Sep-1990, 24 y.o.   MRN: [id number]  HPI   24 year old [place] American man with history of HIV, called initially at [facility] none at [facility] with a history of intermittent compliance with antiretroviral medications. He was previously on Viramune he claimed  along with Truvada and boosted Reyataz. 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 While he was at [facility] he did achieve undetectable viral load on a regimen of Reyataz Norvir Truvada.   I had restarted his Reyataz Norvir Truvada o  He has been only intermittently in care. He had told me that he  dislikes taking the Reyataz Norvir and Truvada do to it making him have difficulty sleeping as well as nausea and a have increased appetite and affecting his mood.  He DID finally get ADAP but this fall when we started him on TRIUMEQ. He took the [facility] for a few months but suffered from nausea vomiting and had hematuria. He also had some abdominal pain and attribute all the symptoms to [facility]. We were not helped by the fact that the doctors at [facility] seem to suggest to him that the [facility] could be responsible for his abdominal pain symptoms something I find highly unlikely.  We had an extensive discussion today about why the patient will not take antiretrovirals reliably. He states that he doesn't like taking pills. When I asked him why he states that he had overdosed once before on medications.. I asked him about whether he took his medicines for bipolar disorder and he said he doesn't take those either.  Again after exhaustive discussion we ultimately started decided to start [place] at last visit in December again and see how he would do on this with an antinausea medicine Zofran on board. He will need to, and renew ADAP application in  January and will see me and then as well.  He states he is tolerating the [place] fairly well they says he still is having headaches and nausea and diarrhea are less bad. He probably has been drinking fairly heavily per nursing staff which is why he missed his lab appointment.     Review of Systems  Constitutional: Negative for fever, chills, diaphoresis, activity change, appetite change, fatigue and unexpected weight change.  HENT: Negative for congestion, rhinorrhea, sinus pressure, sneezing, sore throat and trouble swallowing.   Eyes: Negative for photophobia and visual disturbance.  Respiratory: Negative for cough, chest tightness, shortness of breath, wheezing and stridor.   Cardiovascular: Negative for chest pain, palpitations and leg swelling.  Gastrointestinal: Positive for rectal pain. Negative for nausea, vomiting, abdominal pain, diarrhea, constipation, blood in stool, abdominal distention and anal bleeding.  Genitourinary: Negative for hematuria, flank pain and difficulty urinating.  Musculoskeletal: Negative for myalgias, back pain, joint swelling, arthralgias and gait problem.  Skin: Negative for color change, pallor, rash and wound.  Neurological: Negative for dizziness, tremors, weakness and light-headedness.  Hematological: Negative for adenopathy. Does not bruise/bleed easily.  Psychiatric/Behavioral: Positive for behavioral problems, dysphoric mood and decreased concentration. Negative for confusion, sleep disturbance and agitation.       Objective:   Physical Exam  Constitutional: He is oriented to person, place, and time. He appears well-developed and well-nourished. No distress.  HENT:  Head: Normocephalic and atraumatic.  Mouth/Throat: Oropharynx is clear and moist. No oropharyngeal exudate.  Eyes: Conjunctivae and EOM are normal. Pupils are equal, round, and reactive to light. No scleral icterus.  Neck: Normal range of motion. Neck supple.  Cardiovascular:  Normal rate and regular rhythm.   Pulmonary/Chest: Effort normal. No respiratory distress. He has no wheezes.  Abdominal: Soft. He exhibits no distension.  Musculoskeletal: He exhibits no edema or tenderness.  Neurological: He is alert and oriented to person, place, and time. He exhibits normal muscle tone. Coordination normal.  Skin: Skin is warm and dry. He is not diaphoretic. No erythema. No pallor.  Psychiatric: His behavior is normal. Judgment and thought content normal. He exhibits a depressed mood.       Assessment & Plan:  HIV: Continue TRIUMEQ today check labs and bring back in one month  I spent greater than 25 minutes with the patient including greater than 50% of time in face to face counsel of the patient and in coordination of their care. .  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 Johnnye Sima fro HRA but first lets make sure he can actually come to clinic reliably  Syphilis, gc and chlamydia: continue to recheck Gc and chlamydia today and RPR  Alcohol use :need to get him into a substance abuse counselor

## 2014-07-22 LAB — URINE CYTOLOGY ANCILLARY ONLY
Chlamydia: NEGATIVE
Neisseria Gonorrhea: NEGATIVE

## 2014-07-22 LAB — RPR: RPR Ser Ql: REACTIVE — AB

## 2014-07-22 LAB — HIV-1 RNA ULTRAQUANT REFLEX TO GENTYP+
HIV 1 RNA Quant: 53701 copies/mL — ABNORMAL HIGH (ref <20)
HIV-1 RNA Quant, Log: 4.73 {Log} — ABNORMAL HIGH (ref <1.30)

## 2014-07-22 LAB — FLUORESCENT TREPONEMAL AB(FTA)-IGG-BLD: FLUORESCENT TREPONEMAL ABS: REACTIVE — AB

## 2014-07-22 LAB — RPR TITER

## 2014-07-30 LAB — HIV-1 GENOTYPR PLUS

## 2014-08-08 ENCOUNTER — Encounter: Payer: Self-pay | Admitting: *Deleted

## 2014-08-08 NOTE — Progress Notes (Signed)
Patient ID: [name], male   DOB: [birth year], 24 y.o.   MRN: [id number]  Ct has been enrolled in [facility] w/ [facility] since 01/02/14. Ct's CM is [employer].    CM and ct are working to get ct into regular [place] counseling and hopefully on medication for Schizophrenia/Bipolar.  Cm and ct working on barriers to tx adherence and ongoing inability to achieve viral surpression.  Ct to complete ADAP renewal at appt today.

## 2014-08-20 ENCOUNTER — Other Ambulatory Visit: Payer: Self-pay

## 2014-09-01 ENCOUNTER — Ambulatory Visit: Payer: Self-pay | Admitting: Infectious Disease

## 2014-09-15 ENCOUNTER — Encounter: Payer: Self-pay | Admitting: *Deleted

## 2014-09-15 NOTE — Progress Notes (Signed)
Patient ID: [name], male   DOB: [birth year], 23 y.o.   MRN: [id number] Ct completed ADAP renewal w/ [facility] CM Linwood Dibbles on this date.  Ct reported compliance w/ Triumeq but stated he wanted to go back to his 3 pill/day regimen due to difficulty sleeping on Triumeq.  Ct reported a reduction in auditory hallucinations but still has visual hallucinations regularly.  CM continues to work with ct to get him to enroll in regular [place] counseling.  Ct would benefit from [facility] medication.

## 2015-01-29 ENCOUNTER — Other Ambulatory Visit: Payer: Self-pay

## 2015-02-04 ENCOUNTER — Emergency Department (HOSPITAL_BASED_OUTPATIENT_CLINIC_OR_DEPARTMENT_OTHER)
Admission: EM | Admit: 2015-02-04 | Discharge: 2015-02-04 | Disposition: A | Discharge status: Home / Self Care | Payer: Self-pay | Attending: Emergency Medicine | Admitting: Emergency Medicine

## 2015-02-04 ENCOUNTER — Encounter (HOSPITAL_BASED_OUTPATIENT_CLINIC_OR_DEPARTMENT_OTHER): Payer: Self-pay | Admitting: *Deleted

## 2015-02-04 DIAGNOSIS — Z21 Asymptomatic human immunodeficiency virus [HIV] infection status: Secondary | ICD-10-CM | POA: Insufficient documentation

## 2015-02-04 DIAGNOSIS — Z79899 Other long term (current) drug therapy: Secondary | ICD-10-CM | POA: Insufficient documentation

## 2015-02-04 DIAGNOSIS — Z859 Personal history of malignant neoplasm, unspecified: Secondary | ICD-10-CM | POA: Insufficient documentation

## 2015-02-04 DIAGNOSIS — Z8619 Personal history of other infectious and parasitic diseases: Secondary | ICD-10-CM | POA: Insufficient documentation

## 2015-02-04 DIAGNOSIS — Z9119 Patient's noncompliance with other medical treatment and regimen: Secondary | ICD-10-CM | POA: Insufficient documentation

## 2015-02-04 DIAGNOSIS — Z8659 Personal history of other mental and behavioral disorders: Secondary | ICD-10-CM | POA: Insufficient documentation

## 2015-02-04 DIAGNOSIS — Z862 Personal history of diseases of the blood and blood-forming organs and certain disorders involving the immune mechanism: Secondary | ICD-10-CM | POA: Insufficient documentation

## 2015-02-04 DIAGNOSIS — N390 Urinary tract infection, site not specified: Secondary | ICD-10-CM | POA: Insufficient documentation

## 2015-02-04 DIAGNOSIS — Z720 Tobacco use: Secondary | ICD-10-CM | POA: Insufficient documentation

## 2015-02-04 LAB — URINALYSIS, ROUTINE W REFLEX MICROSCOPIC
Glucose, UA: NEGATIVE mg/dL
Ketones, ur: NEGATIVE mg/dL
Nitrite: NEGATIVE
Protein, ur: 100 mg/dL — AB
Specific Gravity, Urine: 1.022 (ref 1.005–1.030)
Urobilinogen, UA: 1 mg/dL (ref 0.0–1.0)
pH: 6 (ref 5.0–8.0)

## 2015-02-04 LAB — URINE MICROSCOPIC-ADD ON

## 2015-02-04 MED ORDER — CEPHALEXIN 500 MG PO CAPS: 500.0000 mg | ORAL_CAPSULE | Freq: Four times a day (QID) | ORAL | Status: AC

## 2015-02-04 MED ORDER — CEPHALEXIN 250 MG PO CAPS
500.0000 mg | ORAL_CAPSULE | Freq: Once | ORAL | Status: AC
  Administered 2015-02-04: 500 mg via ORAL
  Filled 2015-02-04: qty 2

## 2015-02-04 NOTE — ED Notes (Signed)
Pt c/o painful urination and dark colored urine that began last night. Pt sts the last time he urinated PTA, it looked like straight blood.

## 2015-02-04 NOTE — Discharge Instructions (Signed)
Take your medicines. Follow-up with your PCP and/or ID doctor as needed.  Urinary Tract Infection A urinary tract infection (UTI) can occur any place along the urinary tract. The tract includes the kidneys, ureters, bladder, and urethra. A type of germ called bacteria often causes a UTI. UTIs are often helped with antibiotic medicine.  HOME CARE   If given, take antibiotics as told by your doctor. Finish them even if you start to feel better.  Drink enough fluids to keep your pee (urine) clear or pale yellow.  Avoid tea, drinks with caffeine, and bubbly (carbonated) drinks.  Pee often. Avoid holding your pee in for a long time.  Pee before and after having sex (intercourse).  Wipe from front to back after you poop (bowel movement) if you are a woman. Use each tissue only once. GET HELP RIGHT AWAY IF:   You have back pain.  You have lower belly (abdominal) pain.  You have chills.  You feel sick to your stomach (nauseous).  You throw up (vomit).  Your burning or discomfort with peeing does not go away.  You have a fever.  Your symptoms are not better in 3 days. MAKE SURE YOU:   Understand these instructions.  Will watch your condition.  Will get help right away if you are not doing well or get worse. Document Released: 12/07/2007 Document Revised: 03/14/2012 Document Reviewed: 01/19/2012 [facility] Patient Information 2015 [place], [place]. This information is not intended to replace advice given to you by your health care provider. Make sure you discuss any questions you have with your health care provider.

## 2015-02-05 ENCOUNTER — Other Ambulatory Visit: Payer: Self-pay

## 2015-02-07 LAB — URINE CULTURE

## 2015-02-08 NOTE — ED Provider Notes (Signed)
CSN: [id number]     Arrival date & time 02/04/15  1659 History   First MD Initiated Contact with Patient 02/04/15 1710     Chief Complaint  Patient presents with  . Dysuria     (Consider location/radiation/quality/duration/timing/severity/associated sxs/prior Treatment) HPI   24 year old male with hematuria. Onset last night. Associated with dysuria. No clots. Mild lower abdominal pain. No fevers or chills. No blood thinners. No dizziness, lightheadedness or shortness of breath. Overt bleeding anywhere else. No testicular pain or swelling. No discharge. Denies any flank pain.  Past Medical History  Diagnosis Date  . Immune deficiency disorder   . Cancer   . HIV infection   . Syphilis   . Gonorrhea   . Chlamydia   . Noncompliance   . Abnormal anal Papanicolaou smear   . Schizophrenia   . Bipolar disorder    Past Surgical History  Procedure Laterality Date  . Eye surgery    . Anus surgery     Family History  Problem Relation Age of Onset  . Hypertension Mother    History  Substance Use Topics  . Smoking status: Current Every Day Smoker -- 0.50 packs/day    Types: Cigarettes  . Smokeless tobacco: Never Used     Comment: cutting back  . Alcohol Use: 30.0 oz/week    50 Standard drinks or equivalent per week     Comment: socially    Review of Systems  All systems reviewed and negative, other than as noted in HPI.   Allergies  Review of patient's allergies indicates no known allergies.  Home Medications   Prior to Admission medications   Medication Sig Start Date End Date Taking? Authorizing Provider  Abacavir-Dolutegravir-Lamivud (TRIUMEQ) 600-50-300 MG TABS Take 1 tablet by mouth daily. 06/05/14  Yes Truman Hayward, MD  ondansetron (ZOFRAN) 4 MG tablet Take 1 tablet (4 mg total) by mouth every 6 (six) hours as needed for nausea or vomiting. 06/05/14  Yes Truman Hayward, MD  cephALEXin (KEFLEX) 500 MG capsule Take 1 capsule (500 mg total) by mouth 4  (four) times daily. 02/04/15   Virgel Manifold, MD  imiquimod [place] Sea) 5 % cream  07/07/14   Historical Provider, MD   BP 129/82 mmHg  Pulse 64  Temp(Src) 98.4 F (36.9 C) (Oral)  Resp 18  Ht 6\' 4"  (1.93 m)  Wt 190 lb (86.183 kg)  BMI 23.14 kg/m2  SpO2 99% Physical Exam  Constitutional: He appears well-developed and well-nourished. No distress.  HENT:  Head: Normocephalic and atraumatic.  Eyes: Conjunctivae are normal. Right eye exhibits no discharge. Left eye exhibits no discharge.  Neck: Neck supple.  Cardiovascular: Normal rate, regular rhythm and normal heart sounds.  Exam reveals no gallop and no friction rub.   No murmur heard. Pulmonary/Chest: Effort normal and breath sounds normal. No respiratory distress.  Abdominal: Soft. He exhibits no distension. There is no tenderness.  Musculoskeletal: He exhibits no edema or tenderness.  Neurological: He is alert.  Skin: Skin is warm and dry.  Psychiatric: He has a normal mood and affect. His behavior is normal. Thought content normal.  Nursing note and vitals reviewed.   ED Course  Procedures (including critical care time) Labs Review Labs Reviewed  URINALYSIS, ROUTINE W REFLEX MICROSCOPIC (NOT AT [facility]) - Abnormal; Notable for the following:    Color, Urine AMBER (*)    APPearance CLOUDY (*)    Hgb urine dipstick LARGE (*)    Bilirubin Urine SMALL (*)  Protein, ur 100 (*)    Leukocytes, UA MODERATE (*)    All other components within normal limits  URINE MICROSCOPIC-ADD ON - Abnormal; Notable for the following:    Bacteria, UA FEW (*)    All other components within normal limits  URINE CULTURE    Imaging Review No results found.   EKG Interpretation None      MDM   Final diagnoses:  UTI (lower urinary tract infection)    24 year old male with hematuria and dysuria. Probable UTI. Will place on antibiotics. Will send urine culture. Abdominal exam is benign. Doubt blood thinners. He needs further evaluation if  hematuria does not resolve with treatment.    Virgel Manifold, MD 02/08/15 [phone]

## 2015-02-19 ENCOUNTER — Ambulatory Visit: Payer: Self-pay | Admitting: Infectious Disease

## 2015-02-19 ENCOUNTER — Encounter: Payer: Self-pay | Admitting: *Deleted

## 2015-02-24 ENCOUNTER — Telehealth: Payer: Self-pay | Admitting: *Deleted

## 2015-02-24 NOTE — Telephone Encounter (Signed)
RN received referral information from [facility] [place]. RN received VO from Dr Drucilla Schmidt approving the referral due to poor medication adherence and not in care with provider.  RN contacted the patient and spoke with the patient. RN introduced herself and explained the reason for my call. RN explained to the patient the service that I can offer to him. Pt stated he really would like to service and would like to discuss housing with me. RN offered to make the patient a appt with Dr Drucilla Schmidt and after meeting with Dr Drucilla Schmidt, the patient and I will meet to discuss my services further. Pt agreed, RN offered the patient a appt for tomorrow at 44. Pt asked his grandmother and she stated the patient could not come for that appt. The patient stated he only can come between 12 and 1 and this is the reason he has missed his appointments in the past. 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  Instructed the patient that Dr Drucilla Schmidt is at lunch between 12 and 1. RN asked the pt if his grandmother is aware of his status. Pt stated his grandmother is aware. 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 Pt stated he understands and is already having symptoms of fatigue and little energy. Pt stated he will give me a call or text back after work to discuss a plan for getting him in for a appointment. RN is waiting on a call from the patient to further discuss his appt at this time.

## 2015-06-06 IMAGING — CT CT HEAD W/O CM
3 of 6 series · 15 of 47 positions shown, 18 images · non-contrast
Comparison: None

CT HEAD

CLINICAL DATA: Pain post trauma

CT HEAD WITHOUT CONTRAST
CT CERVICAL SPINE WITHOUT CONTRAST
TECHNIQUE: Multidetector CT imaging of the head and cervical spine
was performed following the standard protocol without intravenous
contrast.  Multiplanar CT image reconstructions of the cervical
spine were also generated.

[Series 5: soft tissue · axial · 0.38mm/px · z∈[+90,+258]mm · 9 of 106 slices shown, 12 images]
[im 11/106  brain]
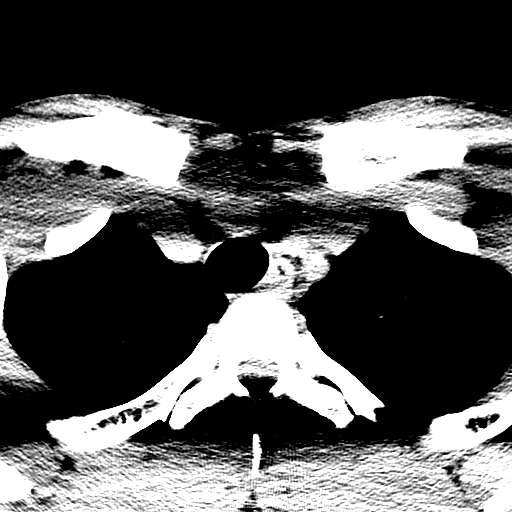
[im 11/106  bone]
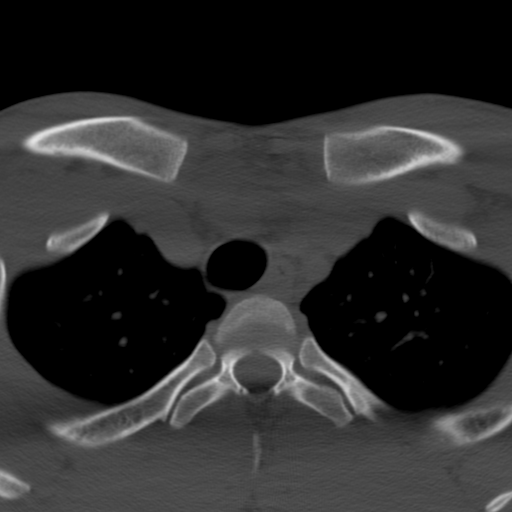
[im 22/106  brain]
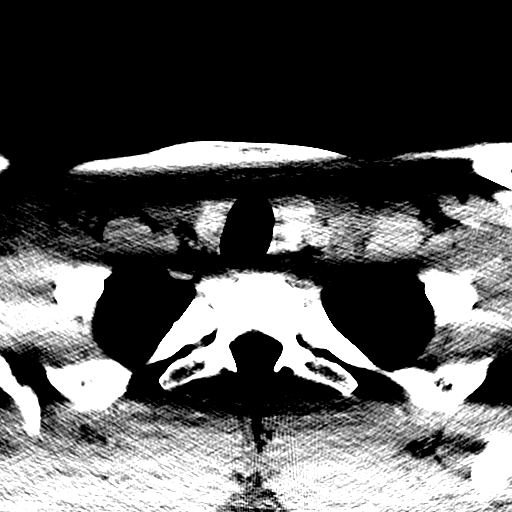
[im 32/106  brain]
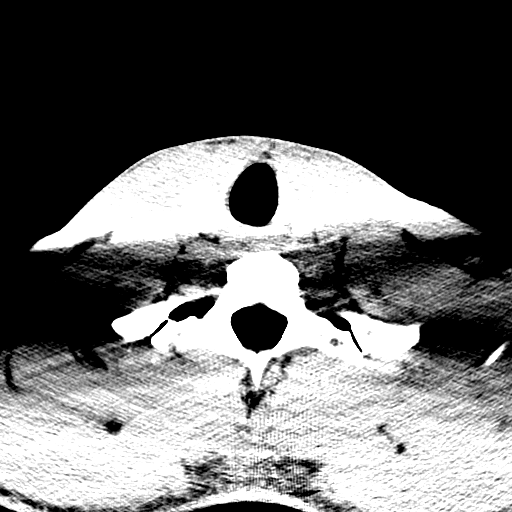
[im 43/106  brain]
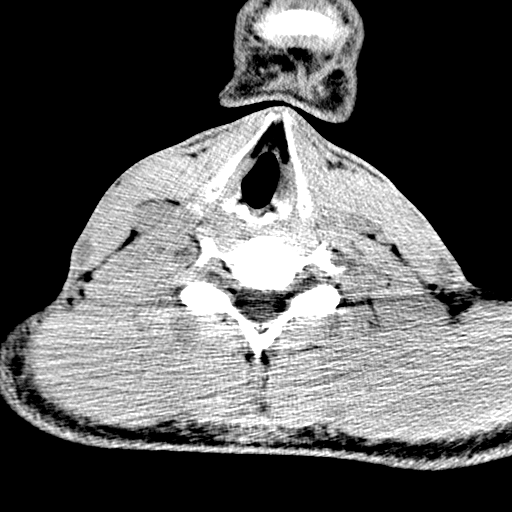
[im 53/106  brain]
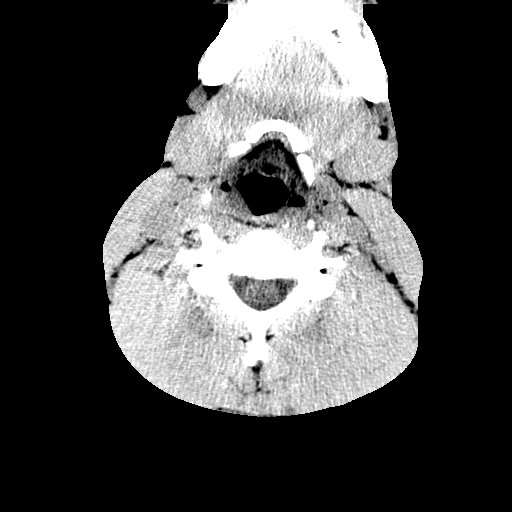
[im 53/106  bone]
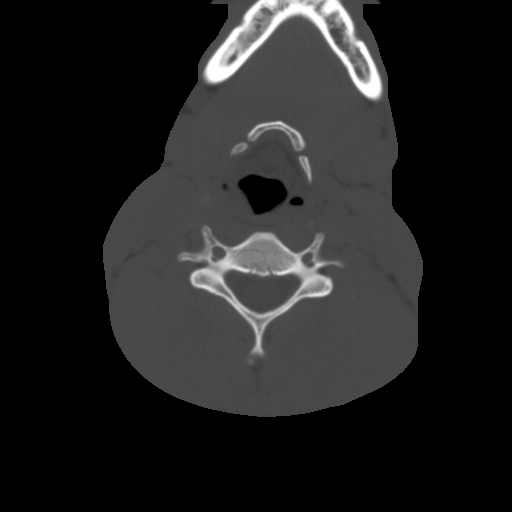
[im 64/106  brain]
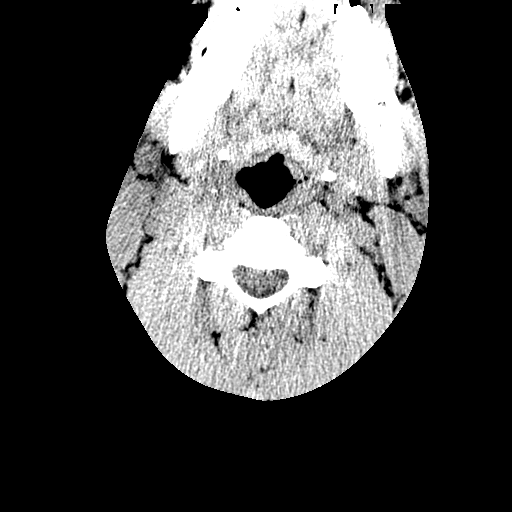
[im 74/106  brain]
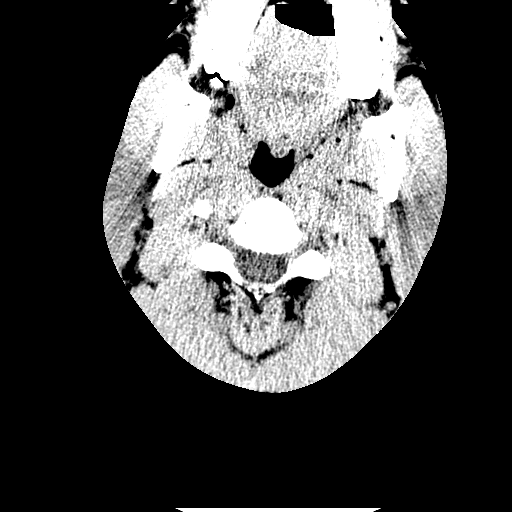
[im 85/106  brain]
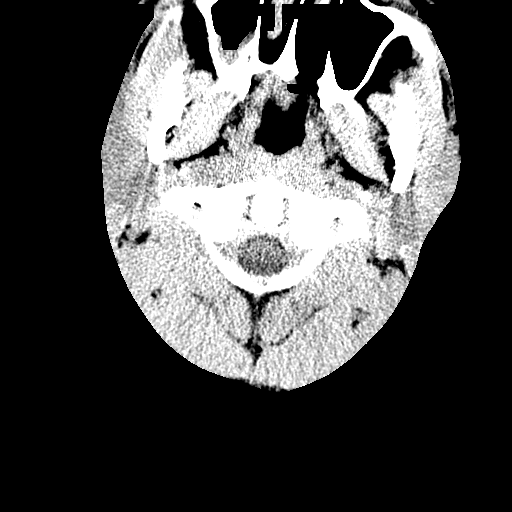
[im 95/106  brain]
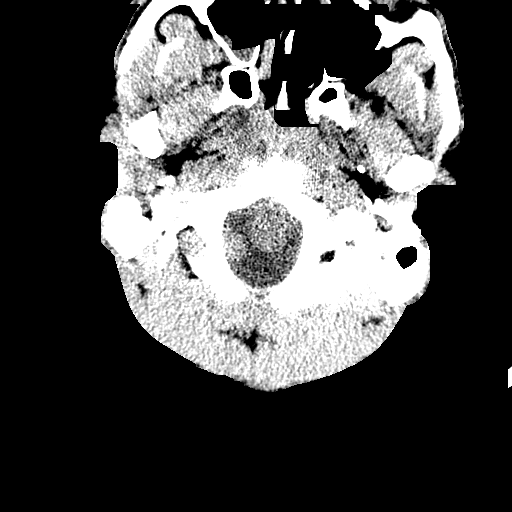
[im 95/106  bone]
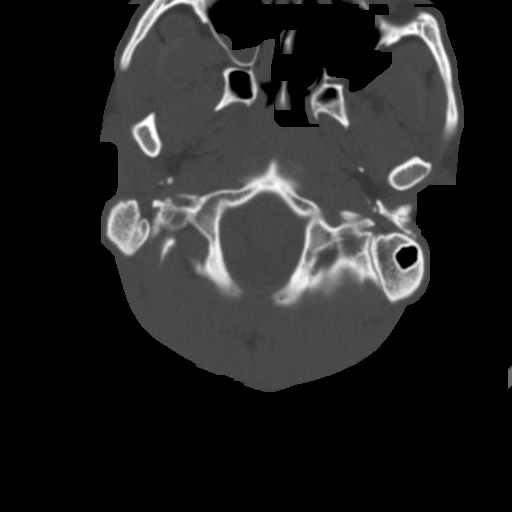

[sagittal · sagittal · 0.41mm/px · 3 of 45 slices shown]
[im 15/45  brain]
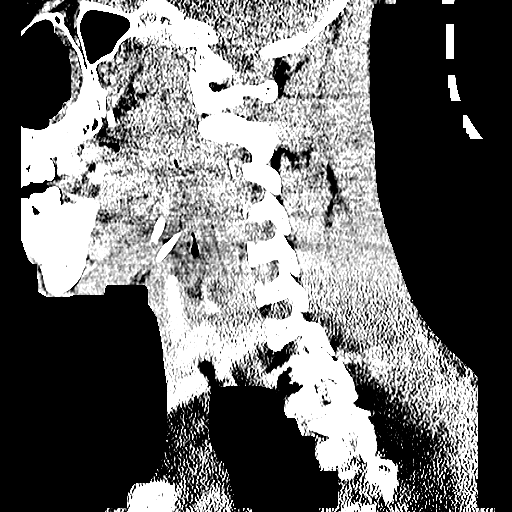
[im 23/45  brain]
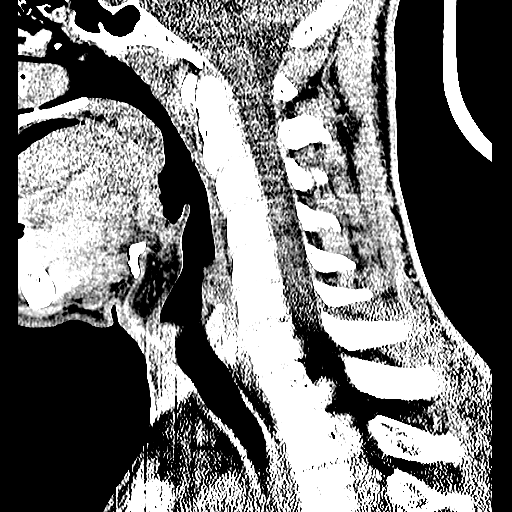
[im 30/45  brain]
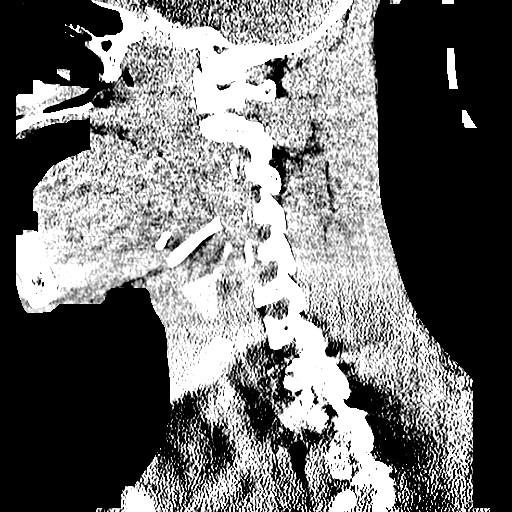

[coronal · coronal · 0.41mm/px · 3 of 42 slices shown]
[im 14/42  brain]
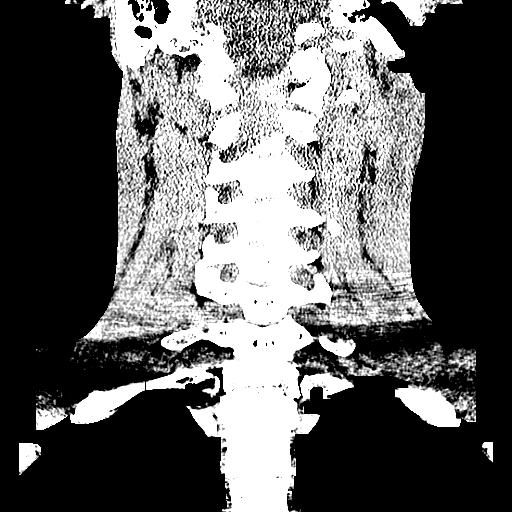
[im 19/42  brain]
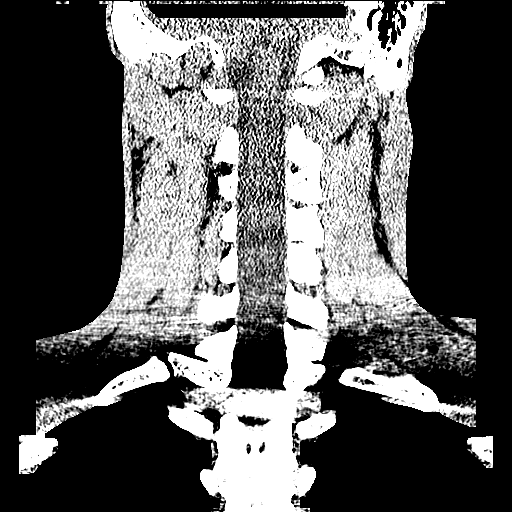
[im 23/42  brain]
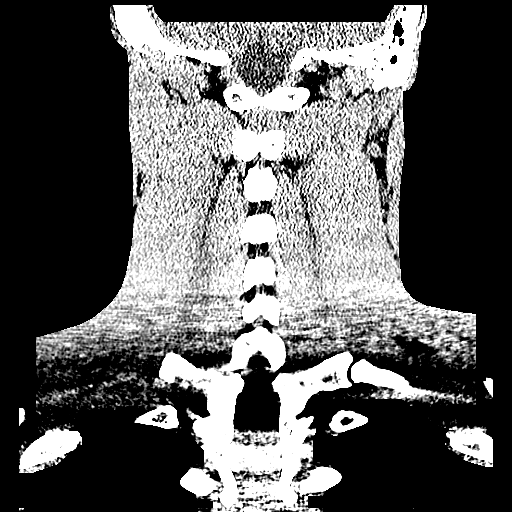

[15 of 47 positions shown; findings below may reference images not displayed]

FINDINGS: Ventricles are normal in size and configuration.  There
is no mass, hemorrhage, extra-axial fluid collection, or midline
shift.  Gray-white compartments are normal.  Bony calvarium appears
intact.  The mastoid air cells are clear.  There is an air-fluid
level of the right maxillary antrum consistent with acute sinus
disease in this area.
IMPRESSION: Acute right maxillary sinus disease.  Study otherwise
unremarkable.

CT CERVICAL SPINE
FINDINGS: There is no fracture or spondylolisthesis.  Prevertebral
soft tissues and predental space regions are normal.  Disc spaces
appear intact.  No disc extrusion or stenosis.  There is no nerve
root edema or effacement.
IMPRESSION: No fracture or spondylolisthesis.  No appreciable arthropathy.

## 2015-07-23 ENCOUNTER — Telehealth: Payer: Self-pay | Admitting: *Deleted

## 2015-07-23 NOTE — Telephone Encounter (Signed)
Needing OV with Dr. Tommy Medal, last OV 07/2014.

## 2015-08-28 ENCOUNTER — Encounter: Payer: Self-pay | Admitting: *Deleted

## 2015-08-28 NOTE — Progress Notes (Signed)
Client has been discharged from Medical Case Management with Case Manager Linwood Dibbles with [facility] due to inconsistent contact.  Client updated Case Manager Vincente Liberty that he had completed an intake appointment with [facility] as of 08/10/15.

## 2015-12-28 ENCOUNTER — Other Ambulatory Visit: Payer: Self-pay | Admitting: Infectious Disease

## 2023-10-20 ENCOUNTER — Emergency Department (HOSPITAL_BASED_OUTPATIENT_CLINIC_OR_DEPARTMENT_OTHER)
Admission: EM | Admit: 2023-10-20 | Discharge: 2023-10-20 | Disposition: A | Discharge status: Home / Self Care | Attending: Emergency Medicine | Admitting: Emergency Medicine

## 2023-10-20 ENCOUNTER — Encounter (HOSPITAL_BASED_OUTPATIENT_CLINIC_OR_DEPARTMENT_OTHER): Payer: Self-pay

## 2023-10-20 ENCOUNTER — Other Ambulatory Visit: Payer: Self-pay

## 2023-10-20 ENCOUNTER — Emergency Department (HOSPITAL_BASED_OUTPATIENT_CLINIC_OR_DEPARTMENT_OTHER)

## 2023-10-20 DIAGNOSIS — M79644 Pain in right finger(s): Secondary | ICD-10-CM | POA: Insufficient documentation

## 2023-10-20 MED ORDER — DOXYCYCLINE HYCLATE 100 MG PO TABS
100.0000 mg | ORAL_TABLET | Freq: Once | ORAL | Status: AC
  Administered 2023-10-20: 100 mg via ORAL
  Filled 2023-10-20: qty 1

## 2023-10-20 MED ORDER — DOXYCYCLINE HYCLATE 100 MG PO CAPS: 100.0000 mg | ORAL_CAPSULE | Freq: Two times a day (BID) | ORAL | 0 refills | Status: AC

## 2023-10-20 NOTE — Discharge Instructions (Signed)
 Take the antibiotic doxycycline  as directed.  Also recommend taking 800 mg of Motrin  every 8 hours.  Follow-up with hand surgery.  Elevate the hand is much as possible.  Soak in warm water for 20 minutes a day.  Return for any new or worse symptoms over the weekend.

## 2023-10-20 NOTE — ED Provider Notes (Signed)
 [place] [facility] Provider Note   CSN: [id number] Arrival date & time: 10/20/23  1820     History  Chief Complaint  Patient presents with   Hand Pain    [name] is a 33 y.o. male.  Patient with complaint of swelling and pain to the right index finger a few days ago.  No known history of any injury but has been working at [employer] has been moving a lot of heavy pieces around.  Possible it could have gotten crushed.  Really no pain around the nailbed area but that is a little bit swollen.  No purulent discharge.  Is painful to bend the finger both at the DIP and PIP joint.  Not at the MP P joint.  Temp here 98.4 pulse 73 respiration 16 blood pressure 118/88 oxygen sats 100%.  Past medical history significant for immune deficiency disorder HIV infection history of STDs.  Schizophrenia bipolar disorder patient is an everyday smoker.  Patient last seen by us  in 2016.  Patient is on antivirals.  Followed by [facility].       Home Medications Prior to Admission medications   Medication Sig Start Date End Date Taking? Authorizing Provider  doxycycline  (VIBRAMYCIN ) 100 MG capsule Take 1 capsule (100 mg total) by mouth 2 (two) times daily. 10/20/23  Yes Donal Lynam, MD  Abacavir -Dolutegravir -Lamivud (TRIUMEQ) 600-50-300 MG TABS Take 1 tablet by mouth daily. 06/05/14   Charolette Copier, MD  cephALEXin  (KEFLEX ) 500 MG capsule Take 1 capsule (500 mg total) by mouth 4 (four) times daily. 02/04/15   Bart Born, MD  imiquimod  (ALDARA ) 5 % cream  07/07/14   [provider]  ondansetron  (ZOFRAN ) 4 MG tablet Take 1 tablet (4 mg total) by mouth every 6 (six) hours as needed for nausea or vomiting. 06/05/14   Charolette Copier, MD      Allergies    Penicillins    Review of Systems   Review of Systems  Constitutional:  Negative for chills and fever.  HENT:  Negative for ear pain and sore throat.   Eyes:   Negative for pain and visual disturbance.  Respiratory:  Negative for cough and shortness of breath.   Cardiovascular:  Negative for chest pain and palpitations.  Gastrointestinal:  Negative for abdominal pain and vomiting.  Genitourinary:  Negative for dysuria and hematuria.  Musculoskeletal:  Positive for joint swelling. Negative for arthralgias and back pain.  Skin:  Negative for color change and rash.  Neurological:  Negative for seizures and syncope.  All other systems reviewed and are negative.   Physical Exam Updated Vital Signs BP 118/88 (BP Location: Left Arm)   Pulse 73   Temp 98.4 F (36.9 C)   Resp 16   Ht 1.93 m (6\' 4" )   Wt 83.9 kg   SpO2 100%   BMI 22.52 kg/m  Physical Exam Vitals and nursing note reviewed.  Constitutional:      General: He is not in acute distress.    Appearance: Normal appearance. He is well-developed. He is not ill-appearing.  HENT:     Head: Normocephalic and atraumatic.  Eyes:     Extraocular Movements: Extraocular movements intact.     Conjunctiva/sclera: Conjunctivae normal.     Pupils: Pupils are equal, round, and reactive to light.  Cardiovascular:     Rate and Rhythm: Normal rate and regular rhythm.     Heart sounds: No murmur heard.  Pulmonary:     Effort: Pulmonary effort is normal. No respiratory distress.     Breath sounds: Normal breath sounds.  Abdominal:     Palpations: Abdomen is soft.     Tenderness: There is no abdominal tenderness.  Musculoskeletal:        General: Swelling and tenderness present.     Cervical back: Normal range of motion and neck supple.     Comments: Right hand index finger with swelling predominantly from the PIP joint distally.  But some swelling to the proximal part of the phalanx.  No fluctuance most of the tenderness really is in the middle phalanx.  Good cap refill.  Sensation intact.  Skin:    General: Skin is warm and dry.     Capillary Refill: Capillary refill takes less than 2 seconds.   Neurological:     General: No focal deficit present.     Mental Status: He is alert and oriented to person, place, and time.  Psychiatric:        Mood and Affect: Mood normal.     ED Results / Procedures / Treatments   Labs (all labs ordered are listed, but only abnormal results are displayed) Labs Reviewed - No data to display  EKG None  Radiology No results found.  Procedures Procedures    Medications Ordered in ED Medications  doxycycline  (VIBRA -TABS) tablet 100 mg (has no administration in time range)    ED Course/ Medical Decision Making/ A&P                                 Medical Decision Making Risk Prescription drug management.   Not clear exactly what happened.  Patient without a clear history of any injury.  He thinks that he kind of jammed it.  Clinically am a little concerned about maybe early infection was thinking in terms of maybe a paronychial infection.  There is some swelling in that area and a little bit of tenderness.  Will treat with doxycycline  antibiotic will recommend soaks.  Will recommend protecting of the finger.  But will not splinted in case it is just jammed so he can move it.  X-ray results not read by radiology but no acute abnormalities in my read.  Radiology reads are taking way too long and patient needed to go.  Final Clinical Impression(s) / ED Diagnoses Final diagnoses:  Finger pain, right    Rx / DC Orders ED Discharge Orders          Ordered    doxycycline  (VIBRAMYCIN ) 100 MG capsule  2 times daily        10/20/23 1958              Andora Krull, MD 10/20/23 2006

## 2023-10-20 NOTE — ED Triage Notes (Signed)
 Pt reports that he started noting his right index finger swelling a few days ago. Doesn't remember any injury. Pt is able to move fingers.

## 2024-06-19 ENCOUNTER — Other Ambulatory Visit: Payer: Self-pay

## 2024-06-19 ENCOUNTER — Emergency Department (HOSPITAL_BASED_OUTPATIENT_CLINIC_OR_DEPARTMENT_OTHER): Admission: EM | Admit: 2024-06-19 | Discharge: 2024-06-19 | Disposition: A | Discharge status: Home / Self Care

## 2024-06-19 ENCOUNTER — Emergency Department (HOSPITAL_BASED_OUTPATIENT_CLINIC_OR_DEPARTMENT_OTHER)

## 2024-06-19 ENCOUNTER — Encounter (HOSPITAL_BASED_OUTPATIENT_CLINIC_OR_DEPARTMENT_OTHER): Payer: Self-pay | Admitting: Emergency Medicine

## 2024-06-19 DIAGNOSIS — M549 Dorsalgia, unspecified: Secondary | ICD-10-CM | POA: Diagnosis present

## 2024-06-19 DIAGNOSIS — M546 Pain in thoracic spine: Secondary | ICD-10-CM | POA: Diagnosis not present

## 2024-06-19 MED ORDER — METHOCARBAMOL 500 MG PO TABS: 500.0000 mg | ORAL_TABLET | Freq: Two times a day (BID) | ORAL | 0 refills | Status: AC

## 2024-06-19 MED ORDER — KETOROLAC TROMETHAMINE 15 MG/ML IJ SOLN
15.0000 mg | Freq: Once | INTRAMUSCULAR | Status: AC
  Administered 2024-06-19: 14:00:00 15 mg via INTRAMUSCULAR
  Filled 2024-06-19: qty 1

## 2024-06-19 MED ORDER — OXYCODONE-ACETAMINOPHEN 5-325 MG PO TABS
1.0000 | ORAL_TABLET | Freq: Once | ORAL | Status: AC
  Administered 2024-06-19: 14:00:00 1 via ORAL
  Filled 2024-06-19: qty 1

## 2024-06-19 MED ORDER — LIDOCAINE 5 % EX PTCH: 1.0000 | MEDICATED_PATCH | CUTANEOUS | 0 refills | Status: AC

## 2024-06-19 MED ORDER — ACETAMINOPHEN 325 MG PO TABS
650.0000 mg | ORAL_TABLET | Freq: Once | ORAL | Status: AC
  Administered 2024-06-19: 14:00:00 650 mg via ORAL
  Filled 2024-06-19: qty 2

## 2024-06-19 MED ORDER — OXYCODONE-ACETAMINOPHEN 5-325 MG PO TABS: 1.0000 | ORAL_TABLET | Freq: Four times a day (QID) | ORAL | 0 refills | Status: AC | PRN

## 2024-06-19 MED ORDER — LIDOCAINE 5 % EX PTCH
1.0000 | MEDICATED_PATCH | Freq: Once | CUTANEOUS | Status: DC
  Administered 2024-06-19: 14:00:00 1 via TRANSDERMAL
  Filled 2024-06-19: qty 1

## 2024-06-19 MED ORDER — DEXAMETHASONE 4 MG PO TABS
8.0000 mg | ORAL_TABLET | Freq: Once | ORAL | Status: AC
  Administered 2024-06-19: 14:00:00 8 mg via ORAL
  Filled 2024-06-19: qty 2

## 2024-06-19 MED ORDER — METHOCARBAMOL 500 MG PO TABS
500.0000 mg | ORAL_TABLET | Freq: Once | ORAL | Status: AC
  Administered 2024-06-19: 14:00:00 500 mg via ORAL
  Filled 2024-06-19: qty 1

## 2024-06-19 NOTE — ED Provider Notes (Signed)
 [name] Provider Note   CSN: [id number] Arrival date & time: 06/19/24  1230     Patient presents with: Back Pain   [name] is a 33 y.o. male.   This is a 33 year old male presenting the emergency department with back pain.  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  He was at work yesterday when he started having acute back pain mid back.  Sharp spasming type pain.  Pain on right side.  No fevers or chills.  Has been taking Tylenol  and ibuprofen  with little improvement.  Pain worsens with movement.  Better at rest.   Back Pain      Prior to Admission medications  Medication Sig Start Date End Date Taking? Authorizing Provider  Abacavir -Dolutegravir -Lamivud (TRIUMEQ) 600-50-300 MG TABS Take 1 tablet by mouth daily. 06/05/14   Fleeta Kathie Jomarie LOISE, MD  cephALEXin  (KEFLEX ) 500 MG capsule Take 1 capsule (500 mg total) by mouth 4 (four) times daily. 02/04/15   Loetta Senior, MD  doxycycline  (VIBRAMYCIN ) 100 MG capsule Take 1 capsule (100 mg total) by mouth 2 (two) times daily. 10/20/23   Zackowski, Scott, MD  imiquimod  (ALDARA ) 5 % cream  07/07/14   [provider]  ondansetron  (ZOFRAN ) 4 MG tablet Take 1 tablet (4 mg total) by mouth every 6 (six) hours as needed for nausea or vomiting. 06/05/14   Fleeta Kathie Jomarie LOISE, MD    Allergies: Patient has no active allergies.    Review of Systems  Musculoskeletal:  Positive for back pain.    Updated Vital Signs BP 128/87 (BP Location: Left Arm)   Pulse 71   Temp 98.3 F (36.8 C) (Oral)   Resp 17   Ht 6' 4 (1.93 m)   Wt 86.2 kg   SpO2 100%   BMI 23.13 kg/m   Physical Exam Vitals and nursing note reviewed.  Constitutional:      General: He is not in acute distress.    Appearance: He is not toxic-appearing.  HENT:     Head: Normocephalic.     Nose: Nose normal.     Mouth/Throat:     Mouth: Mucous membranes are moist.   Eyes:     Conjunctiva/sclera: Conjunctivae normal.  Cardiovascular:     Rate and Rhythm: Normal rate and regular rhythm.  Pulmonary:     Effort: Pulmonary effort is normal.     Breath sounds: Normal breath sounds.  Abdominal:     General: Abdomen is flat.  Musculoskeletal:     Comments: Does have diffuse tender to the left spinal musculature.  Moving arms freely.  Equal pulses.  Skin:    General: Skin is warm and dry.     Capillary Refill: Capillary refill takes less than 2 seconds.  Neurological:     Mental Status: He is alert and oriented to person, place, and time.  Psychiatric:        Mood and Affect: Mood normal.        Behavior: Behavior normal.     (all labs ordered are listed, but only abnormal results are displayed) Labs Reviewed - No data to display  EKG: None  Radiology: DG Thoracic Spine 2 View Result Date: 06/19/2024 CLINICAL DATA:  Back pain after carrying boxes yesterday EXAM: THORACIC SPINE 2 VIEWS COMPARISON:  None Available. FINDINGS: There is no evidence of thoracic spine fracture. Alignment is normal. No other significant bone abnormalities are identified. IMPRESSION:  Negative. Electronically Signed   By: Lynwood Landy Raddle M.D.   On: 06/19/2024 13:45     Procedures   Medications Ordered in the ED  lidocaine  (LIDODERM ) 5 % 1 patch (has no administration in time range)  acetaminophen  (TYLENOL ) tablet 650 mg (has no administration in time range)  methocarbamol  (ROBAXIN ) tablet 500 mg (has no administration in time range)  ketorolac  (TORADOL ) 15 MG/ML injection 15 mg (has no administration in time range)  dexamethasone  (DECADRON ) tablet 8 mg (has no administration in time range)  oxyCODONE -acetaminophen  (PERCOCET/ROXICET) 5-325 MG per tablet 1 tablet (has no administration in time range)    Clinical Course as of 06/19/24 1414  Wed Jun 19, 2024  1348 DG Thoracic Spine 2 View IMPRESSION: Negative   [TY]    Clinical Course User Index [TY] Neysa Caron PARAS, DO                                 Medical Decision Making This is a 33 year old male presenting emergency department with back pain.  Afebrile nontachycardic, normotensive.  Does not appear to be in severe distress.  Does have HIV.  Reports viral load is undetectable.  Show with bipolar and schizophrenia.  Physical exam with no midline spinal tenderness, but does have diffuse tenderness to the thoracic musculature on the left.  X-rays ordered in triage without acute osseous abnormality.  I did review images and agree.  I also do not appreciate wide mediastinum.  Equal pulses.  I have a low suspicion for dissection.  He is immunocompromised, but not having fevers or tachycardia and his symptoms started after moving furniture at work.  Presentation highly suggestive of MSK etiology.  Treated with multimodal pain medications.  Discussed conservative management at this point and follow-up/return precautions.  Will discharge in stable condition with pain medications.  Return precautions given.  Amount and/or Complexity of Data Reviewed Radiology: ordered. Decision-making details documented in ED Course.  Risk OTC drugs. Prescription drug management.       Final diagnoses:  None    ED Discharge Orders     None          Neysa Caron PARAS, DO 06/19/24 1414

## 2024-06-19 NOTE — Discharge Instructions (Addendum)
 As discussed take Tylenol  turning with ibuprofen  with dosages as directed on the packaging.  Alternate every 4 hours.  This will help with baseline pain control.  If you are still having pain use the lidocaine  patches and muscle relaxer.  If pain is still uncontrolled, use the Percocets.  Please follow-up with your primary doctor.  Return for fevers, chills, severe pain, lightheadedness, dizziness, passout, chest pain, shortness of breath, or, return if develop any new or worsening symptoms that are concerning to you.

## 2024-06-19 NOTE — ED Triage Notes (Signed)
 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
